# Patient Record
Sex: Female | Born: 1990 | Race: White | Hispanic: No | Marital: Single | Smoking: Current every day smoker
Health system: Southern US, Community
[De-identification: ages and names within clinical notes are randomized; demographics above are authoritative.]

## PROBLEM LIST (undated history)

## (undated) DIAGNOSIS — T4145XA Adverse effect of unspecified anesthetic, initial encounter: Secondary | ICD-10-CM

## (undated) DIAGNOSIS — Z8489 Family history of other specified conditions: Secondary | ICD-10-CM

## (undated) DIAGNOSIS — T8859XA Other complications of anesthesia, initial encounter: Secondary | ICD-10-CM

## (undated) DIAGNOSIS — E785 Hyperlipidemia, unspecified: Secondary | ICD-10-CM

## (undated) DIAGNOSIS — E559 Vitamin D deficiency, unspecified: Principal | ICD-10-CM

## (undated) DIAGNOSIS — M109 Gout, unspecified: Secondary | ICD-10-CM

## (undated) DIAGNOSIS — M19071 Primary osteoarthritis, right ankle and foot: Secondary | ICD-10-CM

## (undated) HISTORY — DX: Vitamin D deficiency, unspecified: E55.9

## (undated) HISTORY — PX: ANKLE ARTHROSCOPY: SHX545

## (undated) HISTORY — DX: Hyperlipidemia, unspecified: E78.5

---
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
## 2011-04-09 ENCOUNTER — Other Ambulatory Visit (HOSPITAL_COMMUNITY)
Admission: RE | Admit: 2011-04-09 | Discharge: 2011-04-09 | Disposition: A | Discharge status: Home / Self Care | Payer: 59 | Source: Ambulatory Visit | Attending: Obstetrics and Gynecology | Admitting: Obstetrics and Gynecology

## 2011-04-09 DIAGNOSIS — Z01419 Encounter for gynecological examination (general) (routine) without abnormal findings: Secondary | ICD-10-CM | POA: Insufficient documentation

## 2011-04-09 DIAGNOSIS — Z113 Encounter for screening for infections with a predominantly sexual mode of transmission: Secondary | ICD-10-CM | POA: Insufficient documentation

## 2013-04-28 ENCOUNTER — Encounter: Payer: Self-pay | Admitting: Adult Health

## 2013-04-28 ENCOUNTER — Other Ambulatory Visit (HOSPITAL_COMMUNITY)
Admission: RE | Admit: 2013-04-28 | Discharge: 2013-04-28 | Disposition: A | Discharge status: Home / Self Care | Payer: 59 | Source: Ambulatory Visit | Attending: Obstetrics and Gynecology | Admitting: Obstetrics and Gynecology

## 2013-04-28 ENCOUNTER — Ambulatory Visit (INDEPENDENT_AMBULATORY_CARE_PROVIDER_SITE_OTHER): Payer: Self-pay | Admitting: Adult Health

## 2013-04-28 VITALS — BP 110/64 | HR 74 | Ht 70.0 in | Wt 131.0 lb

## 2013-04-28 DIAGNOSIS — Z01419 Encounter for gynecological examination (general) (routine) without abnormal findings: Secondary | ICD-10-CM | POA: Insufficient documentation

## 2013-04-28 DIAGNOSIS — F439 Reaction to severe stress, unspecified: Secondary | ICD-10-CM

## 2013-04-28 DIAGNOSIS — F329 Major depressive disorder, single episode, unspecified: Secondary | ICD-10-CM | POA: Insufficient documentation

## 2013-04-28 MED ORDER — CITALOPRAM HYDROBROMIDE 20 MG PO TABS: 20.0000 mg | ORAL_TABLET | Freq: Every day | ORAL | Status: DC

## 2013-04-28 MED ORDER — NORGESTIMATE-ETH ESTRADIOL 0.25-35 MG-MCG PO TABS: 1.0000 | ORAL_TABLET | Freq: Every day | ORAL | Status: DC

## 2013-04-28 NOTE — Patient Instructions (Addendum)
Depression, Adult Depression refers to feeling sad, low, down in the dumps, blue, gloomy, or empty. In general, there are two kinds of depression: 1. Depression that we all experience from time to time because of upsetting life experiences, including the loss of a job or the ending of a relationship (normal sadness or normal grief). This kind of depression is considered normal, is short lived, and resolves within a few days to 2 weeks. (Depression experienced after the loss of a loved one is called bereavement. Bereavement often lasts longer than 2 weeks but normally gets better with time.) 2. Clinical depression, which lasts longer than normal sadness or normal grief or interferes with your ability to function at home, at work, and in school. It also interferes with your personal relationships. It affects almost every aspect of your life. Clinical depression is an illness. Symptoms of depression also can be caused by conditions other than normal sadness and grief or clinical depression. Examples of these conditions are listed as follows:  Physical illness Some physical illnesses, including underactive thyroid gland (hypothyroidism), severe anemia, specific types of cancer, diabetes, uncontrolled seizures, heart and lung problems, strokes, and chronic pain are commonly associated with symptoms of depression.  Side effects of some prescription medicine In some people, certain types of prescription medicine can cause symptoms of depression.  Substance abuse Abuse of alcohol and illicit drugs can cause symptoms of depression. SYMPTOMS Symptoms of normal sadness and normal grief include the following:  Feeling sad or crying for short periods of time.  Not caring about anything (apathy).  Difficulty sleeping or sleeping too much.  No longer able to enjoy the things you used to enjoy.  Desire to be by oneself all the time (social isolation).  Lack of energy or motivation.  Difficulty  concentrating or remembering.  Change in appetite or weight.  Restlessness or agitation. Symptoms of clinical depression include the same symptoms of normal sadness or normal grief and also the following symptoms:  Feeling sad or crying all the time.  Feelings of guilt or worthlessness.  Feelings of hopelessness or helplessness.  Thoughts of suicide or the desire to harm yourself (suicidal ideation).  Loss of touch with reality (psychotic symptoms). Seeing or hearing things that are not real (hallucinations) or having false beliefs about your life or the people around you (delusions and paranoia). DIAGNOSIS  The diagnosis of clinical depression usually is based on the severity and duration of the symptoms. Your caregiver also will ask you questions about your medical history and substance use to find out if physical illness, use of prescription medicine, or substance abuse is causing your depression. Your caregiver also may order blood tests. TREATMENT  Typically, normal sadness and normal grief do not require treatment. However, sometimes antidepressant medicine is prescribed for bereavement to ease the depressive symptoms until they resolve. The treatment for clinical depression depends on the severity of your symptoms but typically includes antidepressant medicine, counseling with a mental health professional, or a combination of both. Your caregiver will help to determine what treatment is best for you. Depression caused by physical illness usually goes away with appropriate medical treatment of the illness. If prescription medicine is causing depression, talk with your caregiver about stopping the medicine, decreasing the dose, or substituting another medicine. Depression caused by abuse of alcohol or illicit drugs abuse goes away with abstinence from these substances. Some adults need professional help in order to stop drinking or using drugs. SEEK IMMEDIATE CARE IF:  You have   thoughts  about hurting yourself or others.  You lose touch with reality (have psychotic symptoms).  You are taking medicine for depression and have a serious side effect. FOR MORE INFORMATION National Alliance on Mental Illness: www.nami.[employer]: http://www.maynard.net/ Document Released: 08/03/2000 Document Revised: 02/05/2012 Document Reviewed: 11/05/2011 [facility] Patient Information 2014 [place], [place]. Stress Stress-related medical problems are becoming increasingly common. The body has a built-in physical response to stressful situations. Faced with pressure, challenge or danger, we need to react quickly. Our bodies release hormones such as cortisol and adrenaline to help do this. These hormones are part of the "fight or flight" response and affect the metabolic rate, heart rate and blood pressure, resulting in a heightened, stressed state that prepares the body for optimum performance in dealing with a stressful situation. It is likely that early man required these mechanisms to stay alive, but usually modern stresses do not call for this, and the same hormones released in today's world can damage health and reduce coping ability. CAUSES  Pressure to perform at work, at school or in sports.  Threats of physical violence.  Money worries.  Arguments.  Family conflicts.  Divorce or separation from significant other.  Bereavement.  New job or unemployment.  Changes in location.  Alcohol or drug abuse. SOMETIMES, THERE IS NO PARTICULAR REASON FOR DEVELOPING STRESS. Almost all people are at risk of being stressed at some time in their lives. It is important to know that some stress is temporary and some is long term.  Temporary stress will go away when a situation is resolved. Most people can cope with short periods of stress, and it can often be relieved by relaxing, taking a walk, chatting through issues with friends, or having a good night's  sleep.  Chronic (long-term, continuous) stress is much harder to deal with. It can be psychologically and emotionally damaging. It can be harmful both for an individual and for friends and family. SYMPTOMS Everyone reacts to stress differently. There are some common effects that help [place] recognize it. In times of extreme stress, people may:  Shake uncontrollably.  Breathe faster and deeper than normal (hyperventilate).  Vomit.  For people with asthma, stress can trigger an attack.  For some people, stress may trigger migraine headaches, ulcers, and body pain. PHYSICAL EFFECTS OF STRESS MAY INCLUDE:  Loss of energy.  Skin problems.  Aches and pains resulting from tense muscles, including neck ache, backache and tension headaches.  Increased pain from arthritis and other conditions.  Irregular heart beat (palpitations).  Periods of irritability or anger.  Apathy or depression.  Anxiety (feeling uptight or worrying).  Unusual behavior.  Loss of appetite.  Comfort eating.  Lack of concentration.  Loss of, or decreased, sex-drive.  Increased smoking, drinking, or recreational drug use.  For women, missed periods.  Ulcers, joint pain, and muscle pain. Post-traumatic stress is the stress caused by any serious accident, strong emotional damage, or extremely difficult or violent experience such as rape or war. Post-traumatic stress victims can experience mixtures of emotions such as fear, shame, depression, guilt or anger. It may include recurrent memories or images that may be haunting. These feelings can last for weeks, months or even years after the traumatic event that triggered them. Specialized treatment, possibly with medicines and psychological therapies, is available. If stress is causing physical symptoms, severe distress or making it difficult for you to function as normal, it is worth seeing your caregiver. It is important to remember  that although stress is a  usual part of life, extreme or prolonged stress can lead to other illnesses that will need treatment. It is better to visit a doctor sooner rather than later. Stress has been linked to the development of high blood pressure and heart disease, as well as insomnia and depression. There is no diagnostic test for stress since everyone reacts to it differently. But a caregiver will be able to spot the physical symptoms, such as:  Headaches.  Shingles.  Ulcers. Emotional distress such as intense worry, low mood or irritability should be detected when the doctor asks pertinent questions to identify any underlying problems that might be the cause. In case there are physical reasons for the symptoms, the doctor may also want to do some tests to exclude certain conditions. If you feel that you are suffering from stress, try to identify the aspects of your life that are causing it. Sometimes you may not be able to change or avoid them, but even a small change can have a positive ripple effect. A simple lifestyle change can make all the difference. STRATEGIES THAT CAN HELP DEAL WITH STRESS:  Delegating or sharing responsibilities.  Avoiding confrontations.  Learning to be more assertive.  Regular exercise.  Avoid using alcohol or street drugs to cope.  Eating a healthy, balanced diet, rich in fruit and vegetables and proteins.  Finding humor or absurdity in stressful situations.  Never taking on more than you know you can handle comfortably.  Organizing your time better to get as much done as possible.  Talking to friends or family and sharing your thoughts and fears.  Listening to music or relaxation tapes.  Tensing and then relaxing your muscles, starting at the toes and working up to the head and neck. If you think that you would benefit from help, either in identifying the things that are causing your stress or in learning techniques to help you relax, see a caregiver who is capable of  helping you with this. Rather than relying on medications, it is usually better to try and identify the things in your life that are causing stress and try to deal with them. There are many techniques of managing stress including counseling, psychotherapy, aromatherapy, yoga, and exercise. Your caregiver can help you determine what is best for you. Document Released: 10/27/2002 Document Revised: 10/29/2011 Document Reviewed: 09/23/2007 [facility] Patient Information 2014 [place], [place]. Try celexa Go to AA for families Physical in 1 year

## 2013-04-28 NOTE — Progress Notes (Signed)
Patient ID: Wendy Allen, female   DOB: 1991/01/10, 22 y.o.   MRN: [id number] History of Present Illness: Wendy Allen is a 22 year old white female married in for pap and physical.   Current Medications, Allergies, Past Medical History, Past Surgical History, Family History and Social History were reviewed in [employer] record.     Review of Systems: Patient denies any headaches, blurred vision, shortness of breath, chest pain, abdominal pain, problems with bowel movements, urination, or intercourse. No joint pain, she does have hair loss and is stressed with work and school and home.She is depressed at times.Her husband drinks and has had 3 DUIs and a upcoming court date, he is sp a head injury.    Physical Exam:BP 110/64  Pulse 74  Ht 5\' 10"  (1.778 m)  Wt 131 lb (59.421 kg)  BMI 18.8 kg/m2  LMP 04/17/2013 General:  Well developed, well nourished, no acute distress Skin:  Warm and dry Neck:  Midline trachea, normal thyroid Lungs; Clear to auscultation bilaterally Breast:  No dominant palpable mass, retraction, or nipple discharge Cardiovascular: Regular rate and rhythm Abdomen:  Soft, non tender, no hepatosplenomegaly Pelvic:  External genitalia is normal in appearance.  The vagina is normal in appearance. The cervix is nullipareous. Pap performed. Uterus is felt to be normal size, shape, and contour.  No                adnexal masses or tenderness noted. Extremities:  No swelling or varicosities noted Psych:  Alert and cooperative not happy   Impression: Yearly exam Stress Depression    Plan: Physical in 1 year Refill sprintec x 1 year Rx celexa 20 mg #30 1 daily with 6 refills, call with F/U Review hand out on stress and depression Call AA Call Faith in fam[place] given

## 2013-09-10 DIAGNOSIS — M7542 Impingement syndrome of left shoulder: Secondary | ICD-10-CM | POA: Insufficient documentation

## 2014-04-02 ENCOUNTER — Other Ambulatory Visit: Payer: Self-pay | Admitting: Adult Health

## 2014-05-04 ENCOUNTER — Ambulatory Visit (INDEPENDENT_AMBULATORY_CARE_PROVIDER_SITE_OTHER): Payer: BC Managed Care – PPO | Admitting: Adult Health

## 2014-05-04 ENCOUNTER — Encounter: Payer: Self-pay | Admitting: Adult Health

## 2014-05-04 VITALS — BP 110/60 | HR 76 | Ht 68.0 in | Wt 148.0 lb

## 2014-05-04 DIAGNOSIS — Z01419 Encounter for gynecological examination (general) (routine) without abnormal findings: Secondary | ICD-10-CM

## 2014-05-04 DIAGNOSIS — Z3041 Encounter for surveillance of contraceptive pills: Secondary | ICD-10-CM

## 2014-05-04 DIAGNOSIS — Z3202 Encounter for pregnancy test, result negative: Secondary | ICD-10-CM

## 2014-05-04 LAB — POCT URINE PREGNANCY: Preg Test, Ur: NEGATIVE

## 2014-05-04 MED ORDER — NORGESTIMATE-ETH ESTRADIOL 0.25-35 MG-MCG PO TABS: ORAL_TABLET | ORAL | Status: DC

## 2014-05-04 NOTE — Patient Instructions (Signed)
Start OCs today use condoms Pap and physical in 1 year

## 2014-05-04 NOTE — Progress Notes (Signed)
Patient ID: Wendy Allen, female   DOB: 02-10-1991, 23 y.o.   MRN: [id number] History of Present Illness: Wendy Allen is a 23 year old white female, separated, in for a gyn physical and get OCs refilled.Had a normal pap 04/28/13.Has been off pills for a few days.   Current Medications, Allergies, Past Medical History, Past Surgical History, Family History and Social History were reviewed in [employer] record.     Review of Systems: Patient denies any headaches, blurred vision, shortness of breath, chest pain, abdominal pain, problems with bowel movements, urination, or intercourse. No joint pain or mood swings She is Production designer, theatre/television/film at [employer] in [place] and has new partner and is very happy, has stopped celexa.    Physical Exam:BP 110/60  Pulse 76  Ht  (1.727 m)  Wt 148 lb (67.132 kg)  BMI 22.51 kg/m2  LMP 09/06/2015UPT negative General:  Well developed, well nourished, no acute distress Skin:  Warm and dry Neck:  Midline trachea, normal thyroid Lungs; Clear to auscultation bilaterally Breast:  No dominant palpable mass, retraction, or nipple discharge Cardiovascular: Regular rate and rhythm Abdomen:  Soft, non tender, no hepatosplenomegaly Pelvic:  External genitalia is normal in appearance.  The vagina is normal in appearance. The cervix is smooth.  Uterus is felt to be normal size, shape, and contour.  No adnexal masses or tenderness noted. Extremities:  No swelling or varicosities noted Psych:  No mood changes, alert and cooperative,seems happy   Impression: Yearly gyn exam no pap Contraceptive management   Plan: Refilled sprintec x 1 year can start today, use condoms Pap and physicals in 1 year

## 2015-02-14 ENCOUNTER — Other Ambulatory Visit: Payer: Self-pay | Admitting: Adult Health

## 2015-05-12 ENCOUNTER — Encounter: Payer: Self-pay | Admitting: Adult Health

## 2015-05-12 ENCOUNTER — Ambulatory Visit (INDEPENDENT_AMBULATORY_CARE_PROVIDER_SITE_OTHER): Payer: BLUE CROSS/BLUE SHIELD | Admitting: Adult Health

## 2015-05-12 ENCOUNTER — Other Ambulatory Visit (HOSPITAL_COMMUNITY)
Admission: RE | Admit: 2015-05-12 | Discharge: 2015-05-12 | Disposition: A | Discharge status: Home / Self Care | Payer: BLUE CROSS/BLUE SHIELD | Source: Ambulatory Visit | Attending: Adult Health | Admitting: Adult Health

## 2015-05-12 VITALS — BP 120/74 | HR 80 | Ht 70.0 in | Wt 146.0 lb

## 2015-05-12 DIAGNOSIS — Z01419 Encounter for gynecological examination (general) (routine) without abnormal findings: Secondary | ICD-10-CM

## 2015-05-12 DIAGNOSIS — Z3202 Encounter for pregnancy test, result negative: Secondary | ICD-10-CM | POA: Diagnosis not present

## 2015-05-12 DIAGNOSIS — Z113 Encounter for screening for infections with a predominantly sexual mode of transmission: Secondary | ICD-10-CM | POA: Diagnosis present

## 2015-05-12 DIAGNOSIS — F439 Reaction to severe stress, unspecified: Secondary | ICD-10-CM

## 2015-05-12 DIAGNOSIS — Z3041 Encounter for surveillance of contraceptive pills: Secondary | ICD-10-CM

## 2015-05-12 DIAGNOSIS — Z01411 Encounter for gynecological examination (general) (routine) with abnormal findings: Secondary | ICD-10-CM | POA: Diagnosis not present

## 2015-05-12 LAB — POCT URINE PREGNANCY: PREG TEST UR: NEGATIVE

## 2015-05-12 MED ORDER — NORGESTIMATE-ETH ESTRADIOL 0.25-35 MG-MCG PO TABS: 1.0000 | ORAL_TABLET | Freq: Every day | ORAL | Status: DC

## 2015-05-12 MED ORDER — CITALOPRAM HYDROBROMIDE 20 MG PO TABS: 20.0000 mg | ORAL_TABLET | Freq: Every day | ORAL | Status: DC

## 2015-05-12 NOTE — Patient Instructions (Signed)
Start celexa Physical in 1 year

## 2015-05-12 NOTE — Progress Notes (Signed)
Patient ID: Wendy Allen, female   DOB: August 15, 1991, 24 y.o.   MRN: [id number] History of Present Illness: Wendy Allen is a 24 year old white female, in a divorce, in for well woman gyn exam and pap.She is stressed at work and has almost daily headache. Says she is not depressed.She is happy with her OCs, but was late starting them this month, requests UPT.   Current Medications, Allergies, Past Medical History, Past Surgical History, Family History and Social History were reviewed in [employer] record.     Review of Systems: Patient denies any hearing loss, fatigue, blurred vision, shortness of breath, chest pain, abdominal pain, problems with bowel movements, urination, or intercourse. No joint pain or mood swings. See HPI for positives.   Physical Exam:BP 120/74 mmHg  Pulse 80  Ht  (1.778 m)  Wt 146 lb (66.225 kg)  BMI 20.95 kg/m2  LMP 05/05/2015 UPT negative General:  Well developed, well nourished, no acute distress Skin:  Warm and dry Neck:  Midline trachea, normal thyroid, good ROM, no lymphadenopathy Lungs; Clear to auscultation bilaterally Breast:  No dominant palpable mass, retraction, or nipple discharge Cardiovascular: Regular rate and rhythm Abdomen:  Soft, non tender, no hepatosplenomegaly Pelvic:  External genitalia is normal in appearance, no lesions.  The vagina is normal in appearance. Urethra has no lesions or masses. The cervix is smooth, pap with GC/CHL performed.Marland Kitchen  Uterus is felt to be normal size, shape, and contour.  No adnexal masses or tenderness noted.Bladder is non tender, no masses felt. Extremities/musculoskeletal:  No swelling or varicosities noted, no clubbing or cyanosis Psych:  No mood changes, alert and cooperative,seems happy Will rx celexa, has used in past with good results, give it 2-4 weeks then let me know how she is doing.  Impression: Well woman gyn exam with pap Contraceptive management Stress     Plan: Rx  celexa 20 mg #30 take 1 daily with 6 refills Refilled sprintec x 1 year Physical in 1 year Take time for self

## 2015-05-17 LAB — CYTOLOGY - PAP

## 2015-06-13 ENCOUNTER — Emergency Department (HOSPITAL_COMMUNITY)
Admission: EM | Admit: 2015-06-13 | Discharge: 2015-06-13 | Disposition: A | Discharge status: Home / Self Care | Payer: BLUE CROSS/BLUE SHIELD | Attending: Emergency Medicine | Admitting: Emergency Medicine

## 2015-06-13 ENCOUNTER — Encounter (HOSPITAL_COMMUNITY): Payer: Self-pay | Admitting: *Deleted

## 2015-06-13 DIAGNOSIS — Z720 Tobacco use: Secondary | ICD-10-CM | POA: Diagnosis not present

## 2015-06-13 DIAGNOSIS — Z79899 Other long term (current) drug therapy: Secondary | ICD-10-CM | POA: Insufficient documentation

## 2015-06-13 DIAGNOSIS — R51 Headache: Secondary | ICD-10-CM | POA: Diagnosis present

## 2015-06-13 DIAGNOSIS — H04011 Acute dacryoadenitis, right lacrimal gland: Secondary | ICD-10-CM | POA: Insufficient documentation

## 2015-06-13 DIAGNOSIS — Z793 Long term (current) use of hormonal contraceptives: Secondary | ICD-10-CM | POA: Insufficient documentation

## 2015-06-13 MED ORDER — IBUPROFEN 800 MG PO TABS
800.0000 mg | ORAL_TABLET | Freq: Once | ORAL | Status: AC
  Administered 2015-06-13: 800 mg via ORAL
  Filled 2015-06-13: qty 1

## 2015-06-13 MED ORDER — CEPHALEXIN 500 MG PO CAPS: 500.0000 mg | ORAL_CAPSULE | Freq: Four times a day (QID) | ORAL | Status: DC

## 2015-06-13 MED ORDER — CEPHALEXIN 500 MG PO CAPS
500.0000 mg | ORAL_CAPSULE | Freq: Once | ORAL | Status: AC
  Administered 2015-06-13: 500 mg via ORAL
  Filled 2015-06-13: qty 1

## 2015-06-13 MED ORDER — IBUPROFEN 800 MG PO TABS: 800.0000 mg | ORAL_TABLET | Freq: Three times a day (TID) | ORAL | Status: DC

## 2015-06-13 NOTE — Discharge Instructions (Signed)
Take the medicines as prescribed until gone.  Avoid rubbing your eyebrow area.  Apply warm moist compresses for 10 minutes 3-4 times daily.

## 2015-06-13 NOTE — ED Notes (Signed)
Pt c/o eye pain to right eye and states she has a headache

## 2015-06-15 NOTE — ED Provider Notes (Signed)
CSN: [id number]     Arrival date & time 06/13/15  2013 History   First MD Initiated Contact with Patient 06/13/15 2022     Chief Complaint  Patient presents with  . Eye Pain     (Consider location/radiation/quality/duration/timing/severity/associated sxs/prior Treatment) The history is provided by the patient and the spouse.   Wendy Allen is a 24 y.o. female presenting with pain and swelling along her right upper eyelid and brow area.  She describes just casually touching the site today when she noted it to be tender (bruised feeling) and slightly swollen. She denies injury, falls.  Denies visual changes, draining from the eye, pain with eye movement.  She does endorse mild right sided headache. She has taken no medicines or tried any treatments prior to arrival.    Past Medical History  Diagnosis Date  . Contraceptive management 05/04/2014  . Headache    Past Surgical History  Procedure Laterality Date  . Ankle surgery Right 2008   Family History  Problem Relation Age of Onset  . Hypertension Father   . Hyperlipidemia Father   . Other Father     peripheral artery disease  . Diabetes Paternal Grandmother   . Hyperlipidemia Paternal Grandmother   . Hypertension Paternal Grandmother   . Cancer Paternal Grandmother     skin   . Hypertension Paternal Grandfather   . Hyperlipidemia Paternal Grandfather    Social History  Substance Use Topics  . Smoking status: Current Every Day Smoker -- 0.25 packs/day for 8 years    Types: Cigarettes  . Smokeless tobacco: Never Used  . Alcohol Use: No   OB History    Gravida Para Term Preterm AB TAB SAB Ectopic Multiple Living   0 0 0 0 0 0 0 0 0 0      Review of Systems  Constitutional: Negative for fever.  HENT: Negative for congestion and sore throat.   Eyes: Positive for pain. Negative for photophobia, redness and visual disturbance.  Respiratory: Negative for chest tightness and shortness of breath.   Cardiovascular:  Negative for chest pain.  Gastrointestinal: Negative for nausea and abdominal pain.  Genitourinary: Negative.   Musculoskeletal: Negative for joint swelling, arthralgias and neck pain.  Skin: Negative.  Negative for rash and wound.  Neurological: Negative for dizziness, weakness, light-headedness, numbness and headaches.  Psychiatric/Behavioral: Negative.       Allergies  Vicodin and Ortho tri-cyclen  Home Medications   Prior to Admission medications   Medication Sig Start Date End Date Taking? Authorizing Provider  citalopram (CELEXA) 20 MG tablet Take 1 tablet (20 mg total) by mouth daily. 05/12/15  Yes Adline PotterJennifer A Griffin, NP  norgestimate-ethinyl estradiol (SPRINTEC 28) 0.25-35 MG-MCG tablet Take 1 tablet by mouth daily. 05/12/15  Yes Adline PotterJennifer A Griffin, NP  cephALEXin (KEFLEX) 500 MG capsule Take 1 capsule (500 mg total) by mouth 4 (four) times daily. 06/13/15   Burgess AmorJulie Makena Mcgrady, PA-C  ibuprofen (ADVIL,MOTRIN) 800 MG tablet Take 1 tablet (800 mg total) by mouth 3 (three) times daily. 06/13/15   Burgess AmorJulie Jo-Anne Kluth, PA-C   BP 119/81 mmHg  Pulse 79  Temp(Src) 97.7 F (36.5 C) (Oral)  Resp 20  Ht 5\' 10"  (1.778 m)  Wt 155 lb (70.308 kg)  BMI 22.24 kg/m2  SpO2 100%  LMP 06/06/2015 Physical Exam  Constitutional: She is oriented to person, place, and time. She appears well-developed and well-nourished.  HENT:  Head: Normocephalic and atraumatic.  Right Ear: Tympanic membrane and ear canal normal.  Left Ear: Tympanic membrane and ear canal normal.  Nose: No mucosal edema or rhinorrhea.  Mouth/Throat: Uvula is midline, oropharynx is clear and moist and mucous membranes are normal. No oropharyngeal exudate, posterior oropharyngeal edema, posterior oropharyngeal erythema or tonsillar abscesses.  Eyes: Conjunctivae and EOM are normal. Pupils are equal, round, and reactive to light. Lids are everted and swept, no foreign bodies found. Right eye exhibits no chemosis, no discharge, no exudate and no  hordeolum. No foreign body present in the right eye. Left eye exhibits no discharge. Right conjunctiva is not injected.    ttp with mild edema noted right upper lateral eyelid/superior orbital margin. No erythema.  Unable to evert upper lid enough to visualize lacrimal gland.  No temporal artery pain or induration.  Neck: Normal range of motion.  Cardiovascular: Normal rate and normal heart sounds.   Pulmonary/Chest: Effort normal. No respiratory distress.  Musculoskeletal: Normal range of motion.  Neurological: She is alert and oriented to person, place, and time.  Skin: Skin is warm and dry. No rash noted.  Psychiatric: She has a normal mood and affect.    ED Course  Procedures (including critical care time) Labs Review Labs Reviewed - No data to display  Imaging Review No results found. I have personally reviewed and evaluated these images and lab results as part of my medical decision-making.   EKG Interpretation None      MDM   Final diagnoses:  Dacryoadenitis, acute, right    Suspect inflammation of right lacrimal gland. Will also cover for possible early infection.  Keflex, ibuprofen.  Encouraged warm soaks, avoid rubbing site.  Referral to Dr. Lita Mains for further management if sx persist or worsen.  Pt understands and agrees with plan.    Burgess Amor, PA-C 06/15/15 1436  Zadie Rhine, MD 06/16/15 [phone]

## 2016-03-05 ENCOUNTER — Other Ambulatory Visit: Payer: Self-pay | Admitting: Orthopedic Surgery

## 2016-03-05 DIAGNOSIS — E559 Vitamin D deficiency, unspecified: Secondary | ICD-10-CM

## 2016-03-05 DIAGNOSIS — E785 Hyperlipidemia, unspecified: Secondary | ICD-10-CM

## 2016-03-12 ENCOUNTER — Ambulatory Visit (INDEPENDENT_AMBULATORY_CARE_PROVIDER_SITE_OTHER): Payer: BLUE CROSS/BLUE SHIELD | Admitting: Adult Health

## 2016-03-12 ENCOUNTER — Encounter: Payer: Self-pay | Admitting: Adult Health

## 2016-03-12 VITALS — BP 116/60 | HR 84 | Ht 70.0 in | Wt 147.5 lb

## 2016-03-12 DIAGNOSIS — R591 Generalized enlarged lymph nodes: Secondary | ICD-10-CM

## 2016-03-12 DIAGNOSIS — R599 Enlarged lymph nodes, unspecified: Secondary | ICD-10-CM

## 2016-03-12 NOTE — Patient Instructions (Signed)
Leave lymph node alone  Will recheck in 4 weeks

## 2016-03-12 NOTE — Progress Notes (Signed)
Subjective:     Patient ID: Wendy Allen, female   DOB: 1991/02/14, 25 y.o.   MRN: [id number]  HPI Wendy Allen is a 25 year old white female, in complaining of a knot near left hip for 3 weeks and it is non painful.No known injury but got new tattoo.  Review of Systems  +knot near left hip , no pain or injury Reviewed past medical,surgical, social and family history. Reviewed medications and allergies.     Objective:   Physical Exam BP 116/60 (BP Location: Left Arm, Patient Position: Sitting, Cuff Size: Normal)   Pulse 84   Ht 5\' 10"  (1.778 m)   Wt 147 lb 8 oz (66.9 kg)   LMP 02/27/2016 (Approximate)   BMI 21.16 kg/m Skin warm and dry, has 1 cm firm, non tender, non mobile nodule,feels like swollen lymph in left groin,will check CBC and recheck in 4 weeks.Pt having surgery right ankle 03/29/16.    Assessment:     Swollen lymph node in left groin     Plan:     Check CBC Follow up in 4 weeks  Leave alone

## 2016-03-13 LAB — CBC
HEMATOCRIT: 41.6 % (ref 34.0–46.6)
HEMOGLOBIN: 13.9 g/dL (ref 11.1–15.9)
MCH: 30.8 pg (ref 26.6–33.0)
MCHC: 33.4 g/dL (ref 31.5–35.7)
MCV: 92 fL (ref 79–97)
Platelets: 217 10*3/uL (ref 150–379)
RBC: 4.51 x10E6/uL (ref 3.77–5.28)
RDW: 12.6 % (ref 12.3–15.4)
WBC: 9.6 10*3/uL (ref 3.4–10.8)

## 2016-03-20 DIAGNOSIS — M19071 Primary osteoarthritis, right ankle and foot: Secondary | ICD-10-CM

## 2016-03-20 HISTORY — DX: Primary osteoarthritis, right ankle and foot: M19.071

## 2016-03-22 ENCOUNTER — Encounter (HOSPITAL_BASED_OUTPATIENT_CLINIC_OR_DEPARTMENT_OTHER): Payer: Self-pay | Admitting: *Deleted

## 2016-03-29 ENCOUNTER — Encounter (HOSPITAL_BASED_OUTPATIENT_CLINIC_OR_DEPARTMENT_OTHER)
Admission: RE | Disposition: A | Discharge status: Home / Self Care | Payer: Self-pay | Source: Ambulatory Visit | Attending: Orthopedic Surgery

## 2016-03-29 ENCOUNTER — Ambulatory Visit (HOSPITAL_BASED_OUTPATIENT_CLINIC_OR_DEPARTMENT_OTHER)
Admission: RE | Admit: 2016-03-29 | Discharge: 2016-03-29 | Disposition: A | Discharge status: Home / Self Care | Payer: BLUE CROSS/BLUE SHIELD | Source: Ambulatory Visit | Attending: Orthopedic Surgery | Admitting: Orthopedic Surgery

## 2016-03-29 ENCOUNTER — Ambulatory Visit (HOSPITAL_BASED_OUTPATIENT_CLINIC_OR_DEPARTMENT_OTHER): Payer: BLUE CROSS/BLUE SHIELD | Admitting: Certified Registered"

## 2016-03-29 ENCOUNTER — Encounter (HOSPITAL_BASED_OUTPATIENT_CLINIC_OR_DEPARTMENT_OTHER): Payer: Self-pay | Admitting: Certified Registered"

## 2016-03-29 DIAGNOSIS — Z87891 Personal history of nicotine dependence: Secondary | ICD-10-CM | POA: Diagnosis not present

## 2016-03-29 DIAGNOSIS — Z885 Allergy status to narcotic agent status: Secondary | ICD-10-CM | POA: Insufficient documentation

## 2016-03-29 DIAGNOSIS — Z881 Allergy status to other antibiotic agents status: Secondary | ICD-10-CM | POA: Diagnosis not present

## 2016-03-29 DIAGNOSIS — M19071 Primary osteoarthritis, right ankle and foot: Secondary | ICD-10-CM | POA: Insufficient documentation

## 2016-03-29 DIAGNOSIS — Z888 Allergy status to other drugs, medicaments and biological substances status: Secondary | ICD-10-CM | POA: Diagnosis not present

## 2016-03-29 HISTORY — DX: Other complications of anesthesia, initial encounter: T88.59XA

## 2016-03-29 HISTORY — DX: Primary osteoarthritis, right ankle and foot: M19.071

## 2016-03-29 HISTORY — DX: Adverse effect of unspecified anesthetic, initial encounter: T41.45XA

## 2016-03-29 HISTORY — PX: ANKLE ARTHROSCOPY WITH ARTHRODESIS: SHX5579

## 2016-03-29 HISTORY — DX: Family history of other specified conditions: Z84.89

## 2016-03-29 SURGERY — ARTHROSCOPY, ANKLE, WITH FUSION
Anesthesia: General | Site: Ankle | Laterality: Right

## 2016-03-29 MED ORDER — LACTATED RINGERS IV SOLN
INTRAVENOUS | Status: DC
  Administered 2016-03-29 (×2): via INTRAVENOUS

## 2016-03-29 MED ORDER — CEFAZOLIN SODIUM-DEXTROSE 2-4 GM/100ML-% IV SOLN
INTRAVENOUS | Status: AC
  Filled 2016-03-29: qty 100

## 2016-03-29 MED ORDER — HYDROMORPHONE HCL 1 MG/ML IJ SOLN: 0.2500 mg | INTRAMUSCULAR | Status: DC | PRN

## 2016-03-29 MED ORDER — BUPIVACAINE-EPINEPHRINE (PF) 0.5% -1:200000 IJ SOLN
INTRAMUSCULAR | Status: AC
  Filled 2016-03-29: qty 30

## 2016-03-29 MED ORDER — LIDOCAINE 2% (20 MG/ML) 5 ML SYRINGE
INTRAMUSCULAR | Status: AC
  Filled 2016-03-29: qty 5

## 2016-03-29 MED ORDER — ONDANSETRON HCL 4 MG/2ML IJ SOLN
INTRAMUSCULAR | Status: DC | PRN
  Administered 2016-03-29: 4 mg via INTRAVENOUS

## 2016-03-29 MED ORDER — ONDANSETRON HCL 4 MG/2ML IJ SOLN
INTRAMUSCULAR | Status: AC
  Filled 2016-03-29: qty 2

## 2016-03-29 MED ORDER — ROPIVACAINE HCL 5 MG/ML IJ SOLN
INTRAMUSCULAR | Status: DC | PRN
  Administered 2016-03-29 (×2): 10 mL via PERINEURAL

## 2016-03-29 MED ORDER — MIDAZOLAM HCL 2 MG/2ML IJ SOLN
INTRAMUSCULAR | Status: AC
  Filled 2016-03-29: qty 2

## 2016-03-29 MED ORDER — FENTANYL CITRATE (PF) 100 MCG/2ML IJ SOLN
INTRAMUSCULAR | Status: AC
  Filled 2016-03-29: qty 2

## 2016-03-29 MED ORDER — MEPERIDINE HCL 25 MG/ML IJ SOLN: 6.2500 mg | INTRAMUSCULAR | Status: DC | PRN

## 2016-03-29 MED ORDER — PROPOFOL 10 MG/ML IV BOLUS
INTRAVENOUS | Status: DC | PRN
  Administered 2016-03-29: 150 mg via INTRAVENOUS

## 2016-03-29 MED ORDER — CEFAZOLIN SODIUM-DEXTROSE 2-4 GM/100ML-% IV SOLN
2.0000 g | INTRAVENOUS | Status: AC
  Administered 2016-03-29: 2 g via INTRAVENOUS

## 2016-03-29 MED ORDER — PROPOFOL 500 MG/50ML IV EMUL
INTRAVENOUS | Status: AC
  Filled 2016-03-29: qty 50

## 2016-03-29 MED ORDER — SENNA 8.6 MG PO TABS: 2.0000 | ORAL_TABLET | Freq: Two times a day (BID) | ORAL | 0 refills | Status: DC

## 2016-03-29 MED ORDER — ASPIRIN EC 81 MG PO TBEC: 81.0000 mg | DELAYED_RELEASE_TABLET | Freq: Two times a day (BID) | ORAL | 0 refills | Status: DC

## 2016-03-29 MED ORDER — DEXAMETHASONE SODIUM PHOSPHATE 10 MG/ML IJ SOLN
INTRAMUSCULAR | Status: AC
  Filled 2016-03-29: qty 1

## 2016-03-29 MED ORDER — FENTANYL CITRATE (PF) 100 MCG/2ML IJ SOLN
50.0000 ug | INTRAMUSCULAR | Status: DC | PRN
  Administered 2016-03-29: 50 ug via INTRAVENOUS
  Administered 2016-03-29: 100 ug via INTRAVENOUS

## 2016-03-29 MED ORDER — DEXAMETHASONE SODIUM PHOSPHATE 10 MG/ML IJ SOLN
INTRAMUSCULAR | Status: DC | PRN
  Administered 2016-03-29: 10 mg via INTRAVENOUS

## 2016-03-29 MED ORDER — SODIUM CHLORIDE 0.9 % IV SOLN: INTRAVENOUS | Status: DC

## 2016-03-29 MED ORDER — GLYCOPYRROLATE 0.2 MG/ML IJ SOLN: 0.2000 mg | Freq: Once | INTRAMUSCULAR | Status: DC | PRN

## 2016-03-29 MED ORDER — OXYCODONE HCL 5 MG PO TABS: 5.0000 mg | ORAL_TABLET | ORAL | 0 refills | Status: DC | PRN

## 2016-03-29 MED ORDER — LIDOCAINE HCL (CARDIAC) 20 MG/ML IV SOLN
INTRAVENOUS | Status: DC | PRN
  Administered 2016-03-29: 30 mg via INTRAVENOUS

## 2016-03-29 MED ORDER — MIDAZOLAM HCL 2 MG/2ML IJ SOLN
1.0000 mg | INTRAMUSCULAR | Status: DC | PRN
  Administered 2016-03-29 (×2): 2 mg via INTRAVENOUS

## 2016-03-29 MED ORDER — DOCUSATE SODIUM 100 MG PO CAPS: 100.0000 mg | ORAL_CAPSULE | Freq: Two times a day (BID) | ORAL | 0 refills | Status: DC

## 2016-03-29 MED ORDER — OXYCODONE HCL 5 MG PO TABS: 5.0000 mg | ORAL_TABLET | Freq: Once | ORAL | Status: DC | PRN

## 2016-03-29 MED ORDER — SCOPOLAMINE 1 MG/3DAYS TD PT72: 1.0000 | MEDICATED_PATCH | Freq: Once | TRANSDERMAL | Status: DC | PRN

## 2016-03-29 MED ORDER — OXYCODONE HCL 5 MG/5ML PO SOLN: 5.0000 mg | Freq: Once | ORAL | Status: DC | PRN

## 2016-03-29 MED ORDER — BUPIVACAINE-EPINEPHRINE (PF) 0.5% -1:200000 IJ SOLN
INTRAMUSCULAR | Status: DC | PRN
  Administered 2016-03-29 (×2): 10 mL via PERINEURAL

## 2016-03-29 MED ORDER — ONDANSETRON HCL 4 MG PO TABS: 4.0000 mg | ORAL_TABLET | Freq: Every day | ORAL | 0 refills | Status: DC | PRN

## 2016-03-29 MED ORDER — SODIUM CHLORIDE 0.9 % IR SOLN
Status: DC | PRN
  Administered 2016-03-29: 7000 mL

## 2016-03-29 MED ORDER — ONDANSETRON HCL 4 MG/2ML IJ SOLN: 4.0000 mg | Freq: Once | INTRAMUSCULAR | Status: DC | PRN

## 2016-03-29 MED ORDER — CHLORHEXIDINE GLUCONATE 4 % EX LIQD: 60.0000 mL | Freq: Once | CUTANEOUS | Status: DC

## 2016-03-29 SURGICAL SUPPLY — 98 items
BANDAGE ESMARK 6X9 LF (GAUZE/BANDAGES/DRESSINGS) ×1 IMPLANT
BLADE CUDA 2.0 (BLADE) IMPLANT
BLADE CUDA GRT WHITE 3.5 (BLADE) ×2 IMPLANT
BLADE CUDA SHAVER 3.5 (BLADE) IMPLANT
BLADE CUTTER GATOR 3.5 (BLADE) IMPLANT
BLADE GREAT WHITE 4.2 (BLADE) IMPLANT
BLADE GREAT WHITE 4.2MM (BLADE)
BLADE MICRO SAGITTAL (BLADE) IMPLANT
BLADE SURG 15 STRL LF DISP TIS (BLADE) ×2 IMPLANT
BLADE SURG 15 STRL SS (BLADE) ×6
BNDG CMPR 9X6 STRL LF SNTH (GAUZE/BANDAGES/DRESSINGS) ×1
BNDG COHESIVE 4X5 TAN STRL (GAUZE/BANDAGES/DRESSINGS) ×3 IMPLANT
BNDG COHESIVE 6X5 TAN STRL LF (GAUZE/BANDAGES/DRESSINGS) ×3 IMPLANT
BNDG ESMARK 6X9 LF (GAUZE/BANDAGES/DRESSINGS) ×3
BOOT STEPPER DURA LG (SOFTGOODS) IMPLANT
BOOT STEPPER DURA MED (SOFTGOODS) IMPLANT
BOOT STEPPER DURA SM (SOFTGOODS) IMPLANT
BUR 3.5 LG SPHERICAL (BURR) IMPLANT
BUR CUDA 2.9 (BURR) IMPLANT
BUR CUDA 2.9MM (BURR)
BUR FULL RADIUS 2.9 (BURR) IMPLANT
BUR FULL RADIUS 2.9MM (BURR)
BUR GATOR 2.9 (BURR) IMPLANT
BUR GATOR 2.9MM (BURR)
BUR OVAL 4.0 (BURR) ×2 IMPLANT
BUR SPHERICAL 2.9 (BURR) IMPLANT
BUR SPHERICAL 2.9MM (BURR)
BUR VERTEX HOODED 4.5 (BURR) IMPLANT
BURR 3.5 LG SPHERICAL (BURR)
BURR 3.5MM LG SPHERICAL (BURR)
CHLORAPREP W/TINT 26ML (MISCELLANEOUS) ×3 IMPLANT
CLOSURE WOUND 1/2 X4 (GAUZE/BANDAGES/DRESSINGS)
CUFF TOURNIQUET SINGLE 24IN (TOURNIQUET CUFF) ×2 IMPLANT
CUFF TOURNIQUET SINGLE 34IN LL (TOURNIQUET CUFF) ×1 IMPLANT
DRAPE EXTREMITY T 121X128X90 (DRAPE) ×3 IMPLANT
DRAPE INCISE IOBAN 66X45 STRL (DRAPES) IMPLANT
DRAPE OEC MINIVIEW 54X84 (DRAPES) ×3 IMPLANT
DRAPE U-SHAPE 47X51 STRL (DRAPES) ×3 IMPLANT
DRAPE UTILITY XL STRL (DRAPES) IMPLANT
DRSG MEPITEL 4X7.2 (GAUZE/BANDAGES/DRESSINGS) ×2 IMPLANT
DRSG PAD ABDOMINAL 8X10 ST (GAUZE/BANDAGES/DRESSINGS) ×6 IMPLANT
DRSG TEGADERM 2-3/8X2-3/4 SM (GAUZE/BANDAGES/DRESSINGS) IMPLANT
ELECT REM PT RETURN 9FT ADLT (ELECTROSURGICAL) ×3
ELECTRODE REM PT RTRN 9FT ADLT (ELECTROSURGICAL) ×1 IMPLANT
GAUZE SPONGE 4X4 12PLY STRL (GAUZE/BANDAGES/DRESSINGS) ×3 IMPLANT
GLOVE BIO SURGEON STRL SZ8 (GLOVE) ×3 IMPLANT
GLOVE BIOGEL PI IND STRL 7.0 (GLOVE) IMPLANT
GLOVE BIOGEL PI IND STRL 8 (GLOVE) ×2 IMPLANT
GLOVE BIOGEL PI INDICATOR 7.0 (GLOVE) ×4
GLOVE BIOGEL PI INDICATOR 8 (GLOVE) ×4
GLOVE ECLIPSE 6.5 STRL STRAW (GLOVE) ×2 IMPLANT
GLOVE ECLIPSE 7.5 STRL STRAW (GLOVE) ×3 IMPLANT
GLOVE EXAM NITRILE MD LF STRL (GLOVE) IMPLANT
GOWN STRL REUS W/ TWL LRG LVL3 (GOWN DISPOSABLE) ×1 IMPLANT
GOWN STRL REUS W/ TWL XL LVL3 (GOWN DISPOSABLE) ×2 IMPLANT
GOWN STRL REUS W/TWL LRG LVL3 (GOWN DISPOSABLE) ×3
GOWN STRL REUS W/TWL XL LVL3 (GOWN DISPOSABLE) ×6
IV NS IRRIG 3000ML ARTHROMATIC (IV SOLUTION) ×3 IMPLANT
KIT ASP BONE MRW 60CC (KITS) IMPLANT
MANIFOLD NEPTUNE II (INSTRUMENTS) ×3 IMPLANT
NS IRRIG 1000ML POUR BTL (IV SOLUTION) IMPLANT
PACK ARTHROSCOPY DSU (CUSTOM PROCEDURE TRAY) ×3 IMPLANT
PACK BASIN DAY SURGERY FS (CUSTOM PROCEDURE TRAY) ×3 IMPLANT
PAD CAST 4YDX4 CTTN HI CHSV (CAST SUPPLIES) ×1 IMPLANT
PADDING CAST ABS 4INX4YD NS (CAST SUPPLIES)
PADDING CAST ABS COTTON 4X4 ST (CAST SUPPLIES) IMPLANT
PADDING CAST COTTON 4X4 STRL (CAST SUPPLIES) ×3
PADDING CAST COTTON 6X4 STRL (CAST SUPPLIES) ×3 IMPLANT
PENCIL BUTTON HOLSTER BLD 10FT (ELECTRODE) ×3 IMPLANT
PIN GUIDE DRILL TIP 2.8X300 (DRILL) ×2 IMPLANT
SANITIZER HAND PURELL 535ML FO (MISCELLANEOUS) ×3 IMPLANT
SCREW CANNULATED 6.5X50 (Screw) ×2 IMPLANT
SCREW CANNULATED 6.5X55 KNEE (Screw) ×4 IMPLANT
SET IRRIG Y TYPE TUR BLADDER L (SET/KITS/TRAYS/PACK) ×3 IMPLANT
SLEEVE SCD COMPRESS KNEE MED (MISCELLANEOUS) ×3 IMPLANT
SPLINT FAST PLASTER 5X30 (CAST SUPPLIES) ×40
SPLINT PLASTER CAST FAST 5X30 (CAST SUPPLIES) ×20 IMPLANT
SPONGE GAUZE 2X2 8PLY STER LF (GAUZE/BANDAGES/DRESSINGS)
SPONGE GAUZE 2X2 8PLY STRL LF (GAUZE/BANDAGES/DRESSINGS) IMPLANT
SPONGE LAP 18X18 X RAY DECT (DISPOSABLE) ×3 IMPLANT
STOCKINETTE 6  STRL (DRAPES) ×2
STOCKINETTE 6 STRL (DRAPES) IMPLANT
STRAP ANKLE FOOT DISTRACTOR (ORTHOPEDIC SUPPLIES) ×2 IMPLANT
STRIP CLOSURE SKIN 1/2X4 (GAUZE/BANDAGES/DRESSINGS) IMPLANT
SUCTION FRAZIER HANDLE 10FR (MISCELLANEOUS) ×2
SUCTION TUBE FRAZIER 10FR DISP (MISCELLANEOUS) ×1 IMPLANT
SUT ETHILON 3 0 PS 1 (SUTURE) ×3 IMPLANT
SUT MNCRL AB 3-0 PS2 18 (SUTURE) ×3 IMPLANT
SUT VIC AB 0 CT1 27 (SUTURE)
SUT VIC AB 0 CT1 27XBRD ANBCTR (SUTURE) IMPLANT
SYR BULB 3OZ (MISCELLANEOUS) ×3 IMPLANT
TOWEL OR 17X24 6PK STRL BLUE (TOWEL DISPOSABLE) ×6 IMPLANT
TUBE CONNECTING 20'X1/4 (TUBING)
TUBE CONNECTING 20X1/4 (TUBING) IMPLANT
WAND STAR VAC 90 (SURGICAL WAND) IMPLANT
WASHER FLAT 6.5MM (Washer) ×2 IMPLANT
WATER STERILE IRR 1000ML POUR (IV SOLUTION) ×3 IMPLANT
YANKAUER SUCT BULB TIP NO VENT (SUCTIONS) IMPLANT

## 2016-03-29 NOTE — Anesthesia Procedure Notes (Signed)
Procedure Name: LMA Insertion Date/Time: 03/29/2016 7:39 AM Performed by: Daina Cara D Pre-anesthesia Checklist: Patient identified, Emergency Drugs available, Suction available and Patient being monitored Patient Re-evaluated:Patient Re-evaluated prior to inductionOxygen Delivery Method: Circle system utilized Preoxygenation: Pre-oxygenation with 100% oxygen Intubation Type: IV induction Ventilation: Mask ventilation without difficulty LMA: LMA inserted LMA Size: 4.0 Number of attempts: 1 Airway Equipment and Method: Bite block Placement Confirmation: positive ETCO2 Tube secured with: Tape Dental Injury: Teeth and Oropharynx as per pre-operative assessment

## 2016-03-29 NOTE — Discharge Instructions (Addendum)
Toni Arthurs, MD [facility]  Please read the following information regarding your care after surgery.  Medications  You only need a prescription for the narcotic pain medicine (ex. oxycodone, Percocet, Norco).  All of the other medicines listed below are available over the counter. X acetominophen (Tylenol) 650 mg every 4-6 hours as you need for minor pain X oxycodone as prescribed for moderate to severe pain ?   Narcotic pain medicine (ex. oxycodone, Percocet, Vicodin) will cause constipation.  To prevent this problem, take the following medicines while you are taking any pain medicine. X docusate sodium (Colace) 100 mg twice a day X senna (Senokot) 2 tablets twice a day  X To help prevent blood clots, take a baby aspirin (81 mg) twice a day after surgery until you are allowed to initiate weightbearing on your operative extremity.  You should also get up every hour while you are awake to move around.    Weight Bearing X Do not bear any weight on the operated leg or foot.  Cast / Splint / Dressing X Keep your splint or cast clean and dry.  Dont put anything (coat hanger, pencil, etc) down inside of it.  If it gets damp, use a hair dryer on the cool setting to dry it.  If it gets soaked, call the office to schedule an appointment for a cast change.    After your dressing, cast or splint is removed; you may shower, but do not soak or scrub the wound.  Allow the water to run over it, and then gently pat it dry.  Swelling It is normal for you to have swelling where you had surgery.  To reduce swelling and pain, keep your toes above your nose for at least 3 days after surgery.  It may be necessary to keep your foot or leg elevated for several weeks.  If it hurts, it should be elevated.  Follow Up Call my office at [phone] when you are discharged from the hospital or surgery center to schedule an appointment to be seen two weeks after surgery.  Call my office at  [phone] if you develop a fever >101.5 F, nausea, vomiting, bleeding from the surgical site or severe pain.    Regional Anesthesia Blocks  1. Numbness or the inability to move the "blocked" extremity may last from 3-48 hours after placement. The length of time depends on the medication injected and your individual response to the medication. If the numbness is not going away after 48 hours, call your surgeon.  2. The extremity that is blocked will need to be protected until the numbness is gone and the  Strength has returned. Because you cannot feel it, you will need to take extra care to avoid injury. Because it may be weak, you may have difficulty moving it or using it. You may not know what position it is in without looking at it while the block is in effect.  3. For blocks in the legs and feet, returning to weight bearing and walking needs to be done carefully. You will need to wait until the numbness is entirely gone and the strength has returned. You should be able to move your leg and foot normally before you try and bear weight or walk. You will need someone to be with you when you first try to ensure you do not fall and possibly risk injury.  4. Bruising and tenderness at the needle site are common side effects and will resolve in a few  days.  5. Persistent numbness or new problems with movement should be communicated to the surgeon or the [facility] [phone])/ [facility] [phone]).  Post Anesthesia Home Care Instructions  Activity: Get plenty of rest for the remainder of the day. A responsible adult should stay with you for 24 hours following the procedure.  For the next 24 hours, DO NOT: -Drive a car -Advertising copywriterperate machinery -Drink alcoholic beverages -Take any medication unless instructed by your physician -Make any legal decisions or sign important papers.  Meals: Start with liquid foods such as gelatin or soup. Progress to regular foods as  tolerated. Avoid greasy, spicy, heavy foods. If nausea and/or vomiting occur, drink only clear liquids until the nausea and/or vomiting subsides. Call your physician if vomiting continues.  Special Instructions/Symptoms: Your throat may feel dry or sore from the anesthesia or the breathing tube placed in your throat during surgery. If this causes discomfort, gargle with warm salt water. The discomfort should disappear within 24 hours.  If you had a scopolamine patch placed behind your ear for the management of post- operative nausea and/or vomiting:  1. The medication in the patch is effective for 72 hours, after which it should be removed.  Wrap patch in a tissue and discard in the trash. Wash hands thoroughly with soap and water. 2. You may remove the patch earlier than 72 hours if you experience unpleasant side effects which may include dry mouth, dizziness or visual disturbances. 3. Avoid touching the patch. Wash your hands with soap and water after contact with the patch.

## 2016-03-29 NOTE — Anesthesia Procedure Notes (Addendum)
Anesthesia Regional Block:  Adductor canal block  Pre-Anesthetic Checklist: ,, timeout performed, Correct Patient, Correct Site, Correct Laterality, Correct Procedure, Correct Position, site marked, Risks and benefits discussed,  Surgical consent,  Pre-op evaluation,  At surgeon's request and post-op pain management  Laterality: Right and Lower  Prep: chloraprep       Needles:  Injection technique: Single-shot  Needle Type: Echogenic Needle     Needle Length: 9cm 9 cm Needle Gauge: 21 and 21 G    Additional Needles:  Procedures: ultrasound guided (picture in chart) Adductor canal block Narrative:  Start time: 03/29/2016 7:24 AM End time: 03/29/2016 7:15 AM Injection made incrementally with aspirations every 5 mL.  Performed by: Personally  Anesthesiologist: Anhar Mcdermott

## 2016-03-29 NOTE — Transfer of Care (Signed)
Immediate Anesthesia Transfer of Care Note  Patient: Wendy Severericia D Allen  Procedure(s) Performed: Procedure(s): RIGHT ANKLE ARTHROSCOPY WITH ARTHRODESIS (Right)  Patient Location: PACU  Anesthesia Type:GA combined with regional for post-op pain  Level of Consciousness: awake and patient cooperative  Airway & Oxygen Therapy: Patient Spontanous Breathing and Patient connected to face mask oxygen  Post-op Assessment: Report given to RN and Post -op Vital signs reviewed and stable  Post vital signs: Reviewed and stable  Last Vitals:  Vitals:   03/29/16 0934 03/29/16 0935  BP: (!) 107/53   Pulse: 86 84  Resp:  15  Temp:      Last Pain:  Vitals:   03/29/16 0633  PainSc: 2          Complications: No apparent anesthesia complications

## 2016-03-29 NOTE — Progress Notes (Signed)
Assisted Dr. Crews with right, ultrasound guided, popliteal/saphenous block. Side rails up, monitors on throughout procedure. See vital signs in flow sheet. Tolerated Procedure well. 

## 2016-03-29 NOTE — Op Note (Signed)
NAMECHARMAGNE, BUHL NO.:  [id number]  MEDICAL RECORD NO.:  [id number]  LOCATION:                                 FACILITY:  PHYSICIAN:  Toni Arthurs, MD             DATE OF BIRTH:  DATE OF PROCEDURE:  03/29/2016 DATE OF DISCHARGE:                              OPERATIVE REPORT   POSTOPERATIVE DIAGNOSES:  Right ankle osteochondral lesion and arthritis.  POSTOPERATIVE DIAGNOSES:  Right ankle osteochondral lesion and arthritis.  PROCEDURE: 1. Right ankle arthroscopic arthrodesis. 2. AP, mortise, and lateral radiographs of the right ankle.  SURGEON:  Toni Arthurs, MD.  ASSISTANT:  Alfredo Martinez, PA-C.  ANESTHESIA:  General, regional.  ESTIMATED BLOOD LOSS:  Minimal.  TOURNIQUET TIME:  86 minutes, 250 mmHg.  COMPLICATIONS:  None apparent.  DISPOSITION:  Extubated, awake, and stable to recovery room.  INDICATION FOR PROCEDURE:  The patient is a 25 year old woman with a long history of right ankle pain.  She has a large osteochondral lesion of the medial talar dome and has undergone 2 previous ankle arthroscopies with microfracture.  She continues to have significant pain limitations of her activities.  She has failed nonoperative treatment to date and presents today for arthrodesis of the right ankle. She understands the risks and benefits of the alternative treatment options, and elects surgical treatment.  She understands specifically risks of bleeding, infection, nerve damage, blood clots, need for additional surgery, continued pain, nonunion, amputation, and death.  PROCEDURE IN DETAIL:  After preoperative consent was obtained and the correct operative site was identified, the patient was brought to the operating room and placed supine on the operating table.  General anesthesia was induced.  Preoperative antibiotics were administered. Surgical time-out was taken.  The right lower extremity was positioned in a well leg holder, and then prepped and  draped in standard sterile fashion with a tourniquet around the thigh.  A traction strap was applied to the foot and gentle traction was applied.  Foot was exsanguinated and the tourniquet was inflated to 250 mmHg.  The anteromedial arthroscopy portal was established and the arthroscope was inserted into the ankle joint.  An anterolateral arthroscopy portal was then established under direct vision using the Weston Brass And Spread technique.  The ankle joint was inspected.  There was diffuse degenerative changes in the cartilage noted as well as the osteochondral lesion and significant synovitis.  The articular cartilage on both sides of the joint was removed with curettes and shaver.  Once subchondral bone was exposed, a bur was used to remove the layer of subchondral bone exposing healthy medullary bone on both sides of the joint.  The joint was then reduced.  A guide pin for 6.5 mm cannulated Biomet screw was inserted from the posterolateral tibia across to the neck of the talus. An AP foot, lateral ankle, and AP ankle radiographs showed appropriate alignment of the joint and appropriate position of the guide pin.  The guide pin was then utilized to insert a cannulated 6.5 mm partially threaded screw with a washer.  It was tightened and noted to have excellent compression across the ankle joint.  A  K-wire was inserted from anterolateral to posteromedial just in front of the fibula, again through a small incision.  This screw was inserted and was noted to have excellent purchase.  A final screw was inserted from anteromedial to anterolateral.  This was again inserted through a small incision after x- rays confirmed appropriate alignment of the guide pin.  AP mortise and lateral radiographs confirmed appropriate position and length of all hardware and appropriate reduction of the tibiotalar joint.  The wounds were then irrigated and closed with nylon sutures.  Sterile dressings were applied  followed by a well-padded short-leg splint.  Tourniquet was released after application of the dressings at 86 minutes.  The patient was awakened from anesthesia and transported to the recovery room in stable condition.  FOLLOWUP PLAN:  The patient will be nonweightbearing on the right lower extremity.  She will follow up in the office in 2 weeks for suture removal and conversion to a cast.  RADIOGRAPHS:  AP, mortise, and lateral radiographs of the right ankle show interval arthrodesis of the tibiotalar joint.  Hardware is appropriately positioned and of the appropriate length.  No other acute injuries noted.  Alfredo MartinezJustin Ollis, PA-C, was present and scrubbed for the duration of the case.  His assistance was essential in positioning the patient, prepping and draping, gaining and maintaining exposure, performing the operation, closing and dressing the wounds, and applying the splint.     Toni ArthursJohn Enslee Bibbins, MD     JH/MEDQ  D:  03/29/2016  T:  03/29/2016  Job:  [id number]

## 2016-03-29 NOTE — Anesthesia Postprocedure Evaluation (Signed)
Anesthesia Post Note  Patient: Nelda Severericia D Knight  Procedure(s) Performed: Procedure(s) (LRB): RIGHT ANKLE ARTHROSCOPY WITH ARTHRODESIS (Right)  Patient location during evaluation: PACU Anesthesia Type: General Level of consciousness: awake and alert Pain management: pain level controlled Vital Signs Assessment: post-procedure vital signs reviewed and stable Respiratory status: spontaneous breathing, nonlabored ventilation and respiratory function stable Cardiovascular status: blood pressure returned to baseline and stable Postop Assessment: no signs of nausea or vomiting Anesthetic complications: no    Last Vitals:  Vitals:   03/29/16 1015 03/29/16 1040  BP: (!) 140/92 133/78  Pulse: 92 95  Resp: 18 16  Temp:  36.6 C    Last Pain:  Vitals:   03/29/16 1040  TempSrc: Oral  PainSc: 0-No pain                 Senica Crall A

## 2016-03-29 NOTE — Anesthesia Procedure Notes (Addendum)
Anesthesia Regional Block:  Popliteal block  Pre-Anesthetic Checklist: ,, timeout performed, Correct Patient, Correct Site, Correct Laterality, Correct Procedure, Correct Position, site marked, Risks and benefits discussed,  Surgical consent,  Pre-op evaluation,  At surgeon's request and post-op pain management  Laterality: Right and Lower  Prep: chloraprep       Needles:  Injection technique: Single-shot  Needle Type: Echogenic Needle     Needle Length: 9cm 9 cm Needle Gauge: 21 and 21 G    Additional Needles:  Procedures: ultrasound guided (picture in chart) Popliteal block Narrative:  Start time: 03/29/2016 7:07 AM End time: 03/29/2016 7:11 AM Injection made incrementally with aspirations every 5 mL.  Performed by: Personally  Anesthesiologist: Sunni Richardson

## 2016-03-29 NOTE — Brief Op Note (Signed)
03/29/2016  9:44 AM  PATIENT:  Wendy Allen  25 y.o. female  PRE-OPERATIVE DIAGNOSIS:  right ankle arthritis  POST-OPERATIVE DIAGNOSIS:  right ankle arthritis  Procedure(s): 1.  RIGHT ANKLE ARTHROSCOPY WITH ARTHRODESIS 2.  AP, mortise and lateral xrays of the right ankle  SURGEON:  Toni ArthursJohn Acasia Skilton, MD  ASSISTANT:  Alfredo MartinezJustin Ollis, PA-C  ANESTHESIA:   General, regional  EBL:  minimal   TOURNIQUET:   Total Tourniquet Time Documented: Thigh (Right) - 86 minutes Total: Thigh (Right) - 86 minutes  COMPLICATIONS:  None apparent  DISPOSITION:  Extubated, awake and stable to recovery.  DICTATION ID:

## 2016-03-29 NOTE — H&P (Signed)
Wendy Allen is an 25 y.o. female.   Chief Complaint: right ankle pain HPI: 25 y/o female with PMH of smoking c/o a long h/o right ankle pain.  She has had arthroscopy x 2 for a large osteochondral lesion.  She has persistent and progressive pain that is limiting her daily activities.  She has failed non op treatment and presents today for surgery.  Past Medical History:  Diagnosis Date  . 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 Complication of anesthesia    woke up crying after anesthesia  . Family history of adverse reaction to anesthesia    pt's mother has hx. of post-op N/V    Past Surgical History:  Procedure Laterality Date  . ANKLE ARTHROSCOPY Right 2008; 2016    Family History  Problem Relation Age of Onset  . Hypertension Father   . Hyperlipidemia Father   . Diabetes Paternal Grandmother   . Hyperlipidemia Paternal Grandmother   . Hypertension Paternal Grandmother   . Cancer Paternal Grandmother     skin   . Hypertension Paternal Grandfather   . Hyperlipidemia Paternal Grandfather   . Anesthesia problems Mother     post-op N/V   Social History:  reports that she quit smoking about 5 weeks ago. She smoked 0.00 packs per day for 8.00 years. She has never used smokeless tobacco. She reports that she does not drink alcohol or use drugs.  Allergies:  Allergies  Allergen Reactions  . Vicodin [Hydrocodone-Acetaminophen] Nausea And Vomiting  . Ortho Tri-Cyclen [Norgestimate-Eth Estradiol] Rash  . Tetracyclines & Related Rash    Medications Prior to Admission  Medication Sig Dispense Refill  . norgestimate-ethinyl estradiol (SPRINTEC 28) 0.25-35 MG-MCG tablet Take 1 tablet by mouth daily. 84 tablet 4    No results found for this or any previous visit (from the past 48 hour(s)). No results found.  ROS  No recent f/c/n/v/wt loss  Blood pressure (!) 110/57, pulse 71, temperature 98.3 F (36.8 C), resp. rate 18, height 5\' 10"  (1.778 m), weight 66.1 kg (145 lb 12.8  oz), last menstrual period 03/20/2016, SpO2 100 %. Physical Exam  wn wd woman in nad.  A and O x 4.  Mood and affect normal.  EOMI.  resp unlabored.  R ankle with healthy skin.  Surgical incisions well healed.  No signs of infection.  No lymphadenopathy.  2+ dp and pt pulses.  Sens to LT intact at the dorsal and plantar foot.  Assessment/Plan L ankle arthritis - to OR for left ankle arthroscopic arthrodesis.  The risks and benefits of the alternative treatment options have been discussed in detail.  The patient wishes to proceed with surgery and specifically understands risks of bleeding, infection, nerve damage, blood clots, need for additional surgery, amputation and death.   Wendy Allen, Wendy Ciesla, MD 03/29/2016, 7:19 AM

## 2016-03-29 NOTE — Anesthesia Preprocedure Evaluation (Signed)
Anesthesia Evaluation  Patient identified by MRN, date of birth, ID band Patient awake    Reviewed: Allergy & Precautions, NPO status , Patient's Chart, lab work & pertinent test results  Airway Mallampati: I  TM Distance: >3 FB Neck ROM: Full    Dental  (+) Teeth Intact, Dental Advisory Given   Pulmonary former smoker,    breath sounds clear to auscultation       Cardiovascular  Rhythm:Regular Rate:Normal     Neuro/Psych    GI/Hepatic   Endo/Other    Renal/GU      Musculoskeletal   Abdominal   Peds  Hematology   Anesthesia Other Findings   Reproductive/Obstetrics                             Anesthesia Physical Anesthesia Plan  ASA: I  Anesthesia Plan: General   Post-op Pain Management: GA combined w/ Regional for post-op pain   Induction: Intravenous  Airway Management Planned: LMA  Additional Equipment:   Intra-op Plan:   Post-operative Plan: Extubation in OR  Informed Consent: I have reviewed the patients History and Physical, chart, labs and discussed the procedure including the risks, benefits and alternatives for the proposed anesthesia with the patient or authorized representative who has indicated his/her understanding and acceptance.   Dental advisory given  Plan Discussed with: CRNA, Anesthesiologist and Surgeon  Anesthesia Plan Comments:         Anesthesia Quick Evaluation  

## 2016-03-30 ENCOUNTER — Encounter (HOSPITAL_BASED_OUTPATIENT_CLINIC_OR_DEPARTMENT_OTHER): Payer: Self-pay | Admitting: Orthopedic Surgery

## 2016-04-04 ENCOUNTER — Telehealth: Payer: Self-pay | Admitting: *Deleted

## 2016-04-04 NOTE — Telephone Encounter (Signed)
Pt says lymph node gone down and has had ankle fused recently

## 2016-04-04 NOTE — Telephone Encounter (Signed)
Pt states was to follow up with Wendy Allen for lump in breast, pt states lump is gone, she has had a recent surgery can not walk or drive, so unable to keep her f/u appt but will see Wendy Allen in September for annual exam.

## 2016-04-09 ENCOUNTER — Ambulatory Visit: Payer: BLUE CROSS/BLUE SHIELD | Admitting: Adult Health

## 2016-05-15 ENCOUNTER — Other Ambulatory Visit: Payer: BLUE CROSS/BLUE SHIELD | Admitting: Adult Health

## 2016-06-12 ENCOUNTER — Encounter: Payer: Self-pay | Admitting: Adult Health

## 2016-06-12 ENCOUNTER — Ambulatory Visit (INDEPENDENT_AMBULATORY_CARE_PROVIDER_SITE_OTHER): Payer: BLUE CROSS/BLUE SHIELD | Admitting: Adult Health

## 2016-06-12 VITALS — BP 110/60 | HR 76 | Ht 67.5 in | Wt 155.0 lb

## 2016-06-12 DIAGNOSIS — Z3041 Encounter for surveillance of contraceptive pills: Secondary | ICD-10-CM

## 2016-06-12 DIAGNOSIS — Z01419 Encounter for gynecological examination (general) (routine) without abnormal findings: Secondary | ICD-10-CM | POA: Diagnosis not present

## 2016-06-12 MED ORDER — NORGESTIMATE-ETH ESTRADIOL 0.25-35 MG-MCG PO TABS: 1.0000 | ORAL_TABLET | Freq: Every day | ORAL | 4 refills | Status: DC

## 2016-06-12 NOTE — Patient Instructions (Signed)
Physical in 1 year Pap in 2019  

## 2016-06-12 NOTE — Progress Notes (Signed)
Patient ID: Wendy Allen, female   DOB: 02/25/1991, 25 y.o.   MRN: [id number] History of Present Illness: Wendy Allen is a 25 year old white female in for a well woman gyn exam and get OCs refilled.Had normal pap 05/12/15.    Current Medications, Allergies, Past Medical History, Past Surgical History, Family History and Social History were reviewed in [facility] electronic medical record.     Review of Systems: Patient denies any headaches, hearing loss, fatigue, blurred vision, shortness of breath, chest pain, abdominal pain, problems with bowel movements, urination, or intercourse. No joint pain or mood swings.    Physical Exam:BP 110/60 (BP Location: Left Arm, Patient Position: Sitting, Cuff Size: Normal)   Pulse 76   Ht 5' 7.5" (1.715 m)   Wt 155 lb (70.3 kg)   LMP 05/15/2016 (Approximate)   BMI 23.92 kg/m  General:  Well developed, well nourished, no acute distress Skin:  Warm and dry Neck:  Midline trachea, normal thyroid, good ROM, no lymphadenopathy Lungs; Clear to auscultation bilaterally Breast:  No dominant palpable mass, retraction, or nipple discharge Cardiovascular: Regular rate and rhythm Abdomen:  Soft, non tender, no hepatosplenomegaly Pelvic:  External genitalia is normal in appearance, no lesions.  The vagina is normal in appearance. Urethra has no lesions or masses. The cervix is smooth.  Uterus is felt to be normal size, shape, and contour.  No adnexal masses or tenderness noted.Bladder is non tender, no masses felt. Extremities/musculoskeletal:  No swelling or varicosities noted, no clubbing or cyanosis Psych:  No mood changes, alert and cooperative,seems happy PHQ 2 score 0.  Impression:  1. Well woman exam with routine gynecological exam   2. Encounter for surveillance of contraceptive pills      Plan: Check CBC,CMP,TSH and lipids,A1c and vitamin D Refilled sprintec take 1 daily x 1 year  Physical in 1 year, pap in 2019

## 2016-06-13 LAB — COMPREHENSIVE METABOLIC PANEL
A/G RATIO: 1.5 (ref 1.2–2.2)
ALK PHOS: 67 IU/L (ref 39–117)
ALT: 5 IU/L (ref 0–32)
AST: 7 IU/L (ref 0–40)
Albumin: 4.1 g/dL (ref 3.5–5.5)
BILIRUBIN TOTAL: 0.3 mg/dL (ref 0.0–1.2)
BUN/Creatinine Ratio: 11 (ref 9–23)
BUN: 9 mg/dL (ref 6–20)
CALCIUM: 9.5 mg/dL (ref 8.7–10.2)
CO2: 23 mmol/L (ref 18–29)
Chloride: 101 mmol/L (ref 96–106)
Creatinine, Ser: 0.81 mg/dL (ref 0.57–1.00)
GFR calc Af Amer: 115 mL/min/{1.73_m2} (ref >59)
GFR, EST NON AFRICAN AMERICAN: 100 mL/min/{1.73_m2} (ref >59)
GLOBULIN, TOTAL: 2.7 g/dL (ref 1.5–4.5)
Glucose: 85 mg/dL (ref 65–99)
POTASSIUM: 4.4 mmol/L (ref 3.5–5.2)
SODIUM: 138 mmol/L (ref 134–144)
Total Protein: 6.8 g/dL (ref 6.0–8.5)

## 2016-06-13 LAB — TSH: TSH: 1.55 u[IU]/mL (ref 0.450–4.500)

## 2016-06-13 LAB — LIPID PANEL
CHOLESTEROL TOTAL: 257 mg/dL — AB (ref 100–199)
Chol/HDL Ratio: 6.1 ratio units — ABNORMAL HIGH (ref 0.0–4.4)
HDL: 42 mg/dL (ref >39)
LDL CALC: 146 mg/dL — AB (ref 0–99)
Triglycerides: 346 mg/dL — ABNORMAL HIGH (ref 0–149)
VLDL Cholesterol Cal: 69 mg/dL — ABNORMAL HIGH (ref 5–40)

## 2016-06-13 LAB — CBC
HEMATOCRIT: 39.5 % (ref 34.0–46.6)
Hemoglobin: 13.5 g/dL (ref 11.1–15.9)
MCH: 30.4 pg (ref 26.6–33.0)
MCHC: 34.2 g/dL (ref 31.5–35.7)
MCV: 89 fL (ref 79–97)
Platelets: 251 10*3/uL (ref 150–379)
RBC: 4.44 x10E6/uL (ref 3.77–5.28)
RDW: 12.7 % (ref 12.3–15.4)
WBC: 8.4 10*3/uL (ref 3.4–10.8)

## 2016-06-13 LAB — HEMOGLOBIN A1C
ESTIMATED AVERAGE GLUCOSE: 94 mg/dL
Hgb A1c MFr Bld: 4.9 % (ref 4.8–5.6)

## 2016-06-13 LAB — VITAMIN D 25 HYDROXY (VIT D DEFICIENCY, FRACTURES): VIT D 25 HYDROXY: 25.9 ng/mL — AB (ref 30.0–100.0)

## 2016-06-18 ENCOUNTER — Encounter: Payer: Self-pay | Admitting: Adult Health

## 2016-06-18 ENCOUNTER — Telehealth: Payer: Self-pay | Admitting: Adult Health

## 2016-06-18 DIAGNOSIS — E785 Hyperlipidemia, unspecified: Secondary | ICD-10-CM

## 2016-06-18 DIAGNOSIS — E559 Vitamin D deficiency, unspecified: Secondary | ICD-10-CM

## 2016-06-18 HISTORY — DX: Vitamin D deficiency, unspecified: E55.9

## 2016-06-18 HISTORY — DX: Hyperlipidemia, unspecified: E78.5

## 2016-06-18 MED ORDER — FENOFIBRATE 54 MG PO TABS: 54.0000 mg | ORAL_TABLET | Freq: Every day | ORAL | 2 refills | Status: DC

## 2016-06-18 MED ORDER — CHOLECALCIFEROL 125 MCG (5000 UT) PO CAPS: 5000.0000 [IU] | ORAL_CAPSULE | Freq: Every day | ORAL | Status: DC

## 2016-06-18 NOTE — Telephone Encounter (Signed)
Pt aware of labs and need to get trig and cholesterol down and vitamin D up ,will rx tricor 54 mg daily and recheck labs in 3 months(lipids and CMP and vitamin D)and take 5000 IU daily

## 2016-08-20 HISTORY — PX: ANKLE SURGERY: SHX546

## 2016-09-24 ENCOUNTER — Other Ambulatory Visit: Payer: BLUE CROSS/BLUE SHIELD

## 2016-09-24 ENCOUNTER — Other Ambulatory Visit: Payer: Self-pay | Admitting: Adult Health

## 2016-09-25 ENCOUNTER — Telehealth: Payer: Self-pay | Admitting: Adult Health

## 2016-09-25 LAB — LIPID PANEL
CHOLESTEROL TOTAL: 265 mg/dL — AB (ref 100–199)
Chol/HDL Ratio: 5.1 ratio units — ABNORMAL HIGH (ref 0.0–4.4)
HDL: 52 mg/dL (ref >39)
LDL Calculated: 170 mg/dL — ABNORMAL HIGH (ref 0–99)
TRIGLYCERIDES: 215 mg/dL — AB (ref 0–149)
VLDL Cholesterol Cal: 43 mg/dL — ABNORMAL HIGH (ref 5–40)

## 2016-09-25 LAB — VITAMIN D 25 HYDROXY (VIT D DEFICIENCY, FRACTURES): VIT D 25 HYDROXY: 25.9 ng/mL — AB (ref 30.0–100.0)

## 2016-09-25 LAB — COMPREHENSIVE METABOLIC PANEL
A/G RATIO: 1.9 (ref 1.2–2.2)
ALK PHOS: 64 IU/L (ref 39–117)
ALT: 11 IU/L (ref 0–32)
AST: 8 IU/L (ref 0–40)
Albumin: 4.4 g/dL (ref 3.5–5.5)
BILIRUBIN TOTAL: 0.2 mg/dL (ref 0.0–1.2)
BUN/Creatinine Ratio: 14 (ref 9–23)
BUN: 10 mg/dL (ref 6–20)
CHLORIDE: 103 mmol/L (ref 96–106)
CO2: 23 mmol/L (ref 18–29)
Calcium: 9.3 mg/dL (ref 8.7–10.2)
Creatinine, Ser: 0.7 mg/dL (ref 0.57–1.00)
GFR calc non Af Amer: 119 mL/min/{1.73_m2} (ref >59)
GFR, EST AFRICAN AMERICAN: 137 mL/min/{1.73_m2} (ref >59)
GLUCOSE: 87 mg/dL (ref 65–99)
Globulin, Total: 2.3 g/dL (ref 1.5–4.5)
POTASSIUM: 4.7 mmol/L (ref 3.5–5.2)
Sodium: 139 mmol/L (ref 134–144)
Total Protein: 6.7 g/dL (ref 6.0–8.5)

## 2016-09-25 NOTE — Telephone Encounter (Signed)
Left message that vitamin D still low and cholesterol and trig.still not good, are you taking meds and watching diet, call me

## 2016-09-26 ENCOUNTER — Other Ambulatory Visit: Payer: Self-pay | Admitting: Adult Health

## 2016-09-26 ENCOUNTER — Telehealth: Payer: Self-pay | Admitting: Adult Health

## 2016-09-26 NOTE — Telephone Encounter (Signed)
Pt had not been taking meds, so will take daily now and recheck labs in 3 months, placed in recall.will check CMP,lipids and vitamin D

## 2016-12-31 ENCOUNTER — Other Ambulatory Visit: Payer: Self-pay | Admitting: Adult Health

## 2017-01-04 ENCOUNTER — Telehealth: Payer: Self-pay | Admitting: *Deleted

## 2017-01-04 DIAGNOSIS — E559 Vitamin D deficiency, unspecified: Secondary | ICD-10-CM

## 2017-01-04 DIAGNOSIS — E7800 Pure hypercholesterolemia, unspecified: Secondary | ICD-10-CM

## 2017-01-04 NOTE — Telephone Encounter (Signed)
Will put labs in for CMP, cholesterol and vitaminD

## 2017-01-08 ENCOUNTER — Telehealth: Payer: Self-pay | Admitting: Adult Health

## 2017-01-08 LAB — COMPREHENSIVE METABOLIC PANEL
ALT: 14 IU/L (ref 0–32)
AST: 11 IU/L (ref 0–40)
Albumin/Globulin Ratio: 1.8 (ref 1.2–2.2)
Albumin: 4.4 g/dL (ref 3.5–5.5)
Alkaline Phosphatase: 56 IU/L (ref 39–117)
BUN/Creatinine Ratio: 14 (ref 9–23)
BUN: 12 mg/dL (ref 6–20)
Bilirubin Total: 0.5 mg/dL (ref 0.0–1.2)
CO2: 21 mmol/L (ref 18–29)
Calcium: 10.1 mg/dL (ref 8.7–10.2)
Chloride: 101 mmol/L (ref 96–106)
Creatinine, Ser: 0.86 mg/dL (ref 0.57–1.00)
GFR calc Af Amer: 107 mL/min/{1.73_m2} (ref >59)
GFR calc non Af Amer: 93 mL/min/{1.73_m2} (ref >59)
Globulin, Total: 2.4 g/dL (ref 1.5–4.5)
Glucose: 88 mg/dL (ref 65–99)
Potassium: 4.9 mmol/L (ref 3.5–5.2)
Sodium: 137 mmol/L (ref 134–144)
Total Protein: 6.8 g/dL (ref 6.0–8.5)

## 2017-01-08 LAB — LIPID PANEL
Chol/HDL Ratio: 4.7 ratio — ABNORMAL HIGH (ref 0.0–4.4)
Cholesterol, Total: 242 mg/dL — ABNORMAL HIGH (ref 100–199)
HDL: 51 mg/dL (ref >39)
LDL Calculated: 142 mg/dL — ABNORMAL HIGH (ref 0–99)
Triglycerides: 247 mg/dL — ABNORMAL HIGH (ref 0–149)
VLDL Cholesterol Cal: 49 mg/dL — ABNORMAL HIGH (ref 5–40)

## 2017-01-08 LAB — VITAMIN D 25 HYDROXY (VIT D DEFICIENCY, FRACTURES): Vit D, 25-Hydroxy: 55.7 ng/mL (ref 30.0–100.0)

## 2017-01-08 MED ORDER — VITAMIN D3 75 MCG (3000 UT) PO TABS: ORAL_TABLET | ORAL | Status: DC

## 2017-01-08 MED ORDER — FENOFIBRATE 54 MG PO TABS: 54.0000 mg | ORAL_TABLET | Freq: Every day | ORAL | 5 refills | Status: DC

## 2017-01-08 NOTE — Telephone Encounter (Signed)
Pt aware of labs, can decrease vitamin D3 to 2000-3000 IU daily and continue fenfibrate, increase exercise and decrease carbs and recheck labs in 6 months, placed in recall.

## 2017-01-15 ENCOUNTER — Other Ambulatory Visit: Payer: Self-pay | Admitting: Adult Health

## 2017-04-05 ENCOUNTER — Other Ambulatory Visit: Payer: Self-pay | Admitting: Orthopedic Surgery

## 2017-04-05 DIAGNOSIS — M25571 Pain in right ankle and joints of right foot: Principal | ICD-10-CM

## 2017-04-05 DIAGNOSIS — G8929 Other chronic pain: Secondary | ICD-10-CM

## 2017-04-09 ENCOUNTER — Other Ambulatory Visit: Payer: 59

## 2017-04-24 ENCOUNTER — Other Ambulatory Visit: Payer: Self-pay | Admitting: Orthopedic Surgery

## 2017-04-24 DIAGNOSIS — G8929 Other chronic pain: Secondary | ICD-10-CM

## 2017-04-24 DIAGNOSIS — M25571 Pain in right ankle and joints of right foot: Principal | ICD-10-CM

## 2017-05-02 ENCOUNTER — Ambulatory Visit
Admission: RE | Admit: 2017-05-02 | Discharge: 2017-05-02 | Disposition: A | Discharge status: Home / Self Care | Payer: BLUE CROSS/BLUE SHIELD | Source: Ambulatory Visit | Attending: Orthopedic Surgery | Admitting: Orthopedic Surgery

## 2017-05-02 DIAGNOSIS — M25571 Pain in right ankle and joints of right foot: Principal | ICD-10-CM

## 2017-05-02 DIAGNOSIS — G8929 Other chronic pain: Secondary | ICD-10-CM

## 2017-07-26 ENCOUNTER — Other Ambulatory Visit: Payer: Self-pay | Admitting: Women's Health

## 2017-07-26 ENCOUNTER — Other Ambulatory Visit: Payer: Self-pay | Admitting: Adult Health

## 2017-07-30 ENCOUNTER — Other Ambulatory Visit: Payer: Self-pay | Admitting: Adult Health

## 2017-08-26 ENCOUNTER — Telehealth: Payer: Self-pay | Admitting: Adult Health

## 2017-08-26 MED ORDER — NORGESTIMATE-ETH ESTRADIOL 0.25-35 MG-MCG PO TABS: 1.0000 | ORAL_TABLET | Freq: Every day | ORAL | 0 refills | Status: DC

## 2017-08-26 MED ORDER — FENOFIBRATE 54 MG PO TABS: ORAL_TABLET | ORAL | 1 refills | Status: DC

## 2017-08-26 NOTE — Telephone Encounter (Signed)
meds refilled 

## 2017-08-26 NOTE — Telephone Encounter (Signed)
Patient called stating that she was needs a refill of her medication, pt states that she ran out a month ago. Pt scheduled a pap and physical for 2/18 and would like a refill until then. Please contact pt

## 2017-08-26 NOTE — Telephone Encounter (Signed)
Pt is requesting a refill on her Sprintec and fenofibrate. She states that she made an appt for a physical in February but needs refills on the medications before that. [facility] pt [facility] and she should check with her pharmacy in the next 24 hours. Pt verbalized understanding.

## 2017-10-07 ENCOUNTER — Other Ambulatory Visit (HOSPITAL_COMMUNITY)
Admission: RE | Admit: 2017-10-07 | Discharge: 2017-10-07 | Disposition: A | Discharge status: Home / Self Care | Payer: BLUE CROSS/BLUE SHIELD | Source: Ambulatory Visit | Attending: Obstetrics & Gynecology | Admitting: Obstetrics & Gynecology

## 2017-10-07 ENCOUNTER — Ambulatory Visit (INDEPENDENT_AMBULATORY_CARE_PROVIDER_SITE_OTHER): Payer: BLUE CROSS/BLUE SHIELD | Admitting: Adult Health

## 2017-10-07 ENCOUNTER — Encounter: Payer: Self-pay | Admitting: Adult Health

## 2017-10-07 VITALS — BP 94/62 | HR 82 | Ht 68.25 in | Wt 155.0 lb

## 2017-10-07 DIAGNOSIS — Z3041 Encounter for surveillance of contraceptive pills: Secondary | ICD-10-CM | POA: Diagnosis not present

## 2017-10-07 DIAGNOSIS — Z01419 Encounter for gynecological examination (general) (routine) without abnormal findings: Secondary | ICD-10-CM | POA: Diagnosis present

## 2017-10-07 DIAGNOSIS — E7800 Pure hypercholesterolemia, unspecified: Secondary | ICD-10-CM

## 2017-10-07 DIAGNOSIS — E559 Vitamin D deficiency, unspecified: Secondary | ICD-10-CM | POA: Diagnosis not present

## 2017-10-07 DIAGNOSIS — R5383 Other fatigue: Secondary | ICD-10-CM | POA: Diagnosis not present

## 2017-10-07 DIAGNOSIS — Z01411 Encounter for gynecological examination (general) (routine) with abnormal findings: Secondary | ICD-10-CM | POA: Diagnosis not present

## 2017-10-07 MED ORDER — FENOFIBRATE 54 MG PO TABS: ORAL_TABLET | ORAL | 12 refills | Status: DC

## 2017-10-07 MED ORDER — NORGESTIMATE-ETH ESTRADIOL 0.25-35 MG-MCG PO TABS: 1.0000 | ORAL_TABLET | Freq: Every day | ORAL | 4 refills | Status: DC

## 2017-10-07 NOTE — Progress Notes (Signed)
Patient ID: Nelda Severericia D Knight, female   DOB: 08/28/1990, 27 y.o.   MRN: [id number] History of Present Illness: Nicki Guadalajararicia is a 27 year old white female in for a well woman gyn exam and pap. No PCP at this time.   Current Medications, Allergies, Past Medical History, Past Surgical History, Family History and Social History were reviewed in [facility] electronic medical record.     Review of Systems: Patient denies any headaches, hearing loss, blurred vision, shortness of breath, chest pain, abdominal pain, problems with bowel movements, urination, or intercourse. No joint pain or mood swings. Has not been on fenofibrate regularly, will wait and check labs in 3 months. She is happy with her OCs. +tired but has not a day off for over a month.     Physical Exam:BP 94/62 (BP Location: Left Arm, Patient Position: Sitting, Cuff Size: Normal)   Pulse 82   Ht 5' 8.25" (1.734 m)   Wt 155 lb (70.3 kg)   LMP 09/24/2017   BMI 23.40 kg/m  General:  Well developed, well nourished, no acute distress Skin:  Warm and dry Neck:  Midline trachea, normal thyroid, good ROM, no lymphadenopathy Lungs; Clear to auscultation bilaterally Breast:  No dominant palpable mass, retraction, or nipple discharge Cardiovascular: Regular rate and rhythm Abdomen:  Soft, non tender, no hepatosplenomegaly Pelvic:  External genitalia is normal in appearance, no lesions.  The vagina is normal in appearance. Urethra has no lesions or masses. The cervix is smooth and friable with EC brush, pap with reflex HPV performed.   Uterus is felt to be normal size, shape, and contour.  No adnexal masses or tenderness noted.Bladder is non tender, no masses felt. Extremities/musculoskeletal:  No swelling or varicosities noted, no clubbing or cyanosis Psych:  No mood changes, alert and cooperative,seems happy PHQ 2 score 0. She declines STD testing today.   Impression: 1. Encounter for gynecological examination with Papanicolaou smear of  cervix   2. Encounter for surveillance of contraceptive pills   3. Elevated cholesterol   4. Vitamin D deficiency       Plan: Meds ordered this encounter  Medications  . norgestimate-ethinyl estradiol (SPRINTEC 28) 0.25-35 MG-MCG tablet    Sig: Take 1 tablet by mouth daily.    Dispense:  84 tablet    Refill:  4    Order Specific Question:   Supervising Provider    Answer:   Despina HiddenEURE, LUTHER H [[id number]]  . fenofibrate 54 MG tablet    Sig: TAKE (1) TABLET BY MOUTH ONCE DAILY.    Dispense:  30 tablet    Refill:  12    Order Specific Question:   Supervising Provider    Answer:   Duane LopeEURE, LUTHER H [[id number]]  Continue Vitamin D Return in 3 months for fasting labs (CBC,CMP,TSH, lipids and vitamin D) Physical in 1 year Pap in 3 if normal Mammogram at 38

## 2017-10-09 LAB — CYTOLOGY - PAP: Diagnosis: NEGATIVE

## 2018-07-14 DIAGNOSIS — M84374A Stress fracture, right foot, initial encounter for fracture: Secondary | ICD-10-CM | POA: Insufficient documentation

## 2018-09-01 ENCOUNTER — Other Ambulatory Visit: Payer: Self-pay | Admitting: Adult Health

## 2018-11-20 ENCOUNTER — Other Ambulatory Visit: Payer: Self-pay | Admitting: Adult Health

## 2018-12-29 ENCOUNTER — Encounter: Payer: Self-pay | Admitting: Internal Medicine

## 2019-01-06 ENCOUNTER — Other Ambulatory Visit: Payer: Self-pay | Admitting: Adult Health

## 2019-01-16 ENCOUNTER — Ambulatory Visit: Payer: BLUE CROSS/BLUE SHIELD | Admitting: Podiatry

## 2019-01-16 ENCOUNTER — Other Ambulatory Visit: Payer: Self-pay

## 2019-01-16 ENCOUNTER — Telehealth: Payer: Self-pay | Admitting: *Deleted

## 2019-01-16 VITALS — Temp 97.7°F

## 2019-01-16 DIAGNOSIS — M79676 Pain in unspecified toe(s): Secondary | ICD-10-CM

## 2019-01-16 DIAGNOSIS — L600 Ingrowing nail: Secondary | ICD-10-CM

## 2019-01-16 MED ORDER — NEOMYCIN-POLYMYXIN-HC 3.5-10000-1 OT SOLN: OTIC | 0 refills | Status: DC

## 2019-01-16 NOTE — Progress Notes (Signed)
Subjective:  Patient ID: Wendy Allen, female    DOB: April 23, 1991,  MRN: [id number]  Chief Complaint  Patient presents with  . Nail Problem    Pt states right foot 1st nail lateral border is ingrown, 2wk duration. Pt states no drainage but it is sore.   28 y.o. female presents with the above complaint.   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  Review of Systems: Negative except as noted in the HPI. Denies N/V/F/Ch.  Past Medical History:  Diagnosis Date  . 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 Complication of anesthesia    woke up crying after anesthesia  . Dyslipidemia 06/18/2016  . Family history of adverse reaction to anesthesia    pt's mother has hx. of post-op N/V  . Vitamin D deficiency 06/18/2016    Current Outpatient Medications:  .  butalbital-acetaminophen-caffeine (FIORICET) 50-325-40 MG tablet, butalbital-acetaminophen-caffeine 50 mg-325 mg-40 mg tablet, Disp: , Rfl:  .  Cholecalciferol (VITAMIN D3) 3000 units TABS, Take 1 daily (Patient taking differently: Take 1 every other day), Disp: 30 tablet, Rfl:  .  cyclobenzaprine (FLEXERIL) 10 MG tablet, cyclobenzaprine 10 mg tablet, Disp: , Rfl:  .  diclofenac (VOLTAREN) 75 MG EC tablet, diclofenac sodium 75 mg tablet,delayed release, Disp: , Rfl:  .  fenofibrate 160 MG tablet, , Disp: , Rfl:  .  fenofibrate 54 MG tablet, TAKE (1) TABLET BY MOUTH ONCE DAILY., Disp: 30 tablet, Rfl: 0 .  ibuprofen (ADVIL,MOTRIN) 200 MG tablet, Take 800 mg by mouth as needed., Disp: , Rfl:  .  meloxicam (MOBIC) 15 MG tablet, , Disp: , Rfl:  .  predniSONE (DELTASONE) 10 MG tablet, , Disp: , Rfl:  .  SPRINTEC 28 0.25-35 MG-MCG tablet, TAKE ONE TABLET BY MOUTH ONCE DAILY., Disp: 84 tablet, Rfl: 0 .  traMADol (ULTRAM) 50 MG tablet, Take by mouth every 6 (six) hours as needed., Disp: , Rfl:  .  neomycin-polymyxin-hydrocortisone (CORTISPORIN) OTIC solution, Apply 2 drops to the ingrown  toenail site twice daily. Cover with band-aid., Disp: 10 mL, Rfl: 0  Social History   Tobacco Use  Smoking Status Former Smoker  . Packs/day: 0.00  . Years: 8.00  . Pack years: 0.00  . Types: Cigarettes  . Last attempt to quit: 02/23/2016  . Years since quitting: 2.8  Smokeless Tobacco Never Used    Allergies  Allergen Reactions  . Vicodin [Hydrocodone-Acetaminophen] Nausea And Vomiting  . Ortho Tri-Cyclen [Norgestimate-Eth Estradiol] Rash  . Tetracyclines & Related Rash   Objective:   Vitals:   01/16/19 1136  Temp: 97.7 F (36.5 C)   There is no height or weight on file to calculate BMI. Constitutional Well developed. Well nourished.  Vascular Dorsalis pedis pulses palpable bilaterally. Posterior tibial pulses palpable bilaterally. Capillary refill normal to all digits.  No cyanosis or clubbing noted. Pedal hair growth normal.  Neurologic Normal speech. Oriented to person, place, and time. Epicritic sensation to light touch grossly present bilaterally.  Dermatologic Painful ingrowing nail at lateral nail borders of the hallux nail right. No other open wounds. No skin lesions.  Orthopedic: Normal joint ROM without pain or crepitus bilaterally. No visible deformities. No bony tenderness.   Radiographs: None Assessment:   1. Ingrown nail   2. Pain around toenail    Plan:  Patient was evaluated and treated and all questions answered.  Ingrown Nail, right -Patient elects to proceed with minor surgery to remove  ingrown toenail removal today. Consent reviewed and signed by patient. -Ingrown nail excised. See procedure note. -Educated on post-procedure care including soaking. Written instructions provided and reviewed. -Patient to follow up in 2 weeks for nail check.  Procedure: Excision of Ingrown Toenail Location: Right 1st lateral nail borders. Anesthesia: Lidocaine 1% plain; 1.5 mL and Marcaine 0.5% plain; 1.5 mL, digital block. Skin Prep: Betadine.  Dressing: Silvadene; telfa; dry, sterile, compression dressing. Technique: Following skin prep, the toe was exsanguinated and a tourniquet was secured at the base of the toe. The affected nail border was freed, split with a nail splitter, and excised. Chemical matrixectomy was then performed with phenol and irrigated out with alcohol. The tourniquet was then removed and sterile dressing applied. Disposition: Patient tolerated procedure well. Patient to return in 2 weeks for follow-up.   Return in about 2 weeks (around 01/30/2019) for Nail Check.

## 2019-01-16 NOTE — Patient Instructions (Signed)

## 2019-01-16 NOTE — Telephone Encounter (Signed)
Pt complained of feeling hot, and lightheaded and faint after putting on her mask. Terrilee Croak, CMA, and Reyne Dumas, CMA gave pt water and sat her down in the lobby. I came to take her BP after getting off the phone with a pt. BP - 118/74. Pt stated she feels better and this is why she only puts her mask on when she has to be with other people.

## 2019-01-30 ENCOUNTER — Ambulatory Visit (INDEPENDENT_AMBULATORY_CARE_PROVIDER_SITE_OTHER): Payer: BC Managed Care – PPO | Admitting: Podiatry

## 2019-01-30 ENCOUNTER — Encounter: Payer: Self-pay | Admitting: Podiatry

## 2019-01-30 ENCOUNTER — Other Ambulatory Visit: Payer: Self-pay

## 2019-01-30 VITALS — Temp 98.6°F

## 2019-01-30 DIAGNOSIS — L600 Ingrowing nail: Secondary | ICD-10-CM | POA: Diagnosis not present

## 2019-01-30 DIAGNOSIS — M79676 Pain in unspecified toe(s): Secondary | ICD-10-CM | POA: Diagnosis not present

## 2019-02-01 NOTE — Progress Notes (Signed)
  Subjective:  Patient ID: Wendy Allen, female    DOB: [birth year],  MRN: [id number]  Chief Complaint  Patient presents with  . Nail Problem    my right big toenail is doing good   28 y.o. female returns for the above complaint. Nail is doing well no pain.  Objective:   General AA&O x3. Normal mood and affect.  Vascular Foot warm and well perfused with good capillary refill.  Neurologic Sensation grossly intact.  Dermatologic Nail avulsion site healing well without drainage or erythema. Nail bed with overlying soft crust. Left intact. No signs of local infection.  Orthopedic: No tenderness to palpation of the toe.   Assessment & Plan:  Patient was evaluated and treated and all questions answered.  S/p Ingrown Toenail Excision, right -Healing well without issue. -Discussed return precautions. -F/u PRN

## 2019-02-08 IMAGING — CT CT ANKLE*R* W/O CM
1 series · 12 of 14 positions shown, 15 images · non-contrast
Comparison: 10/14/2001 CT

CLINICAL DATA: Chronic right ankle pain. Evaluate hardware status
post fusion x1 year ago.

EXAM:
CT OF THE RIGHT ANKLE WITHOUT CONTRAST
TECHNIQUE: Multidetector CT imaging of the right ankle was performed according
to the standard protocol. Multiplanar CT image reconstructions were
also generated.

[Series 4: soft tissue lower extremity (person_name) · axial · 0.30mm/px · z∈[-226,-72]mm · 12 of 91 slices shown, 15 images]
[im 7/91  soft-tissue]
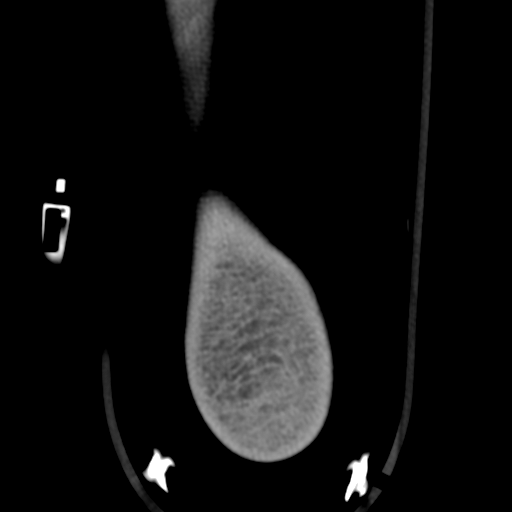
[im 7/91  bone]
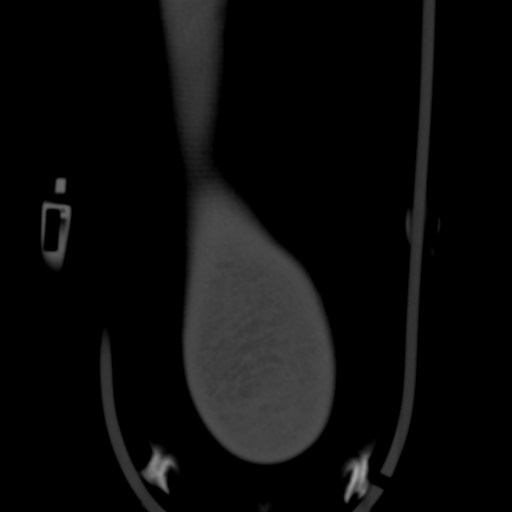
[im 14/91  bone]
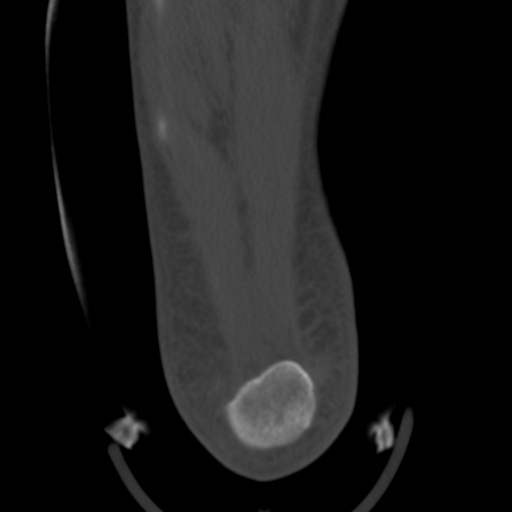
[im 21/91  bone]
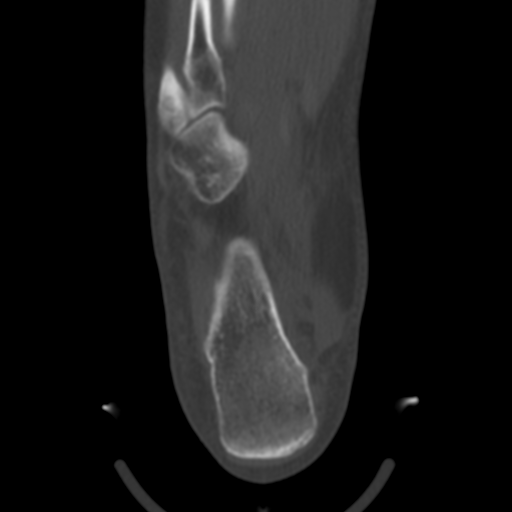
[im 28/91  bone]
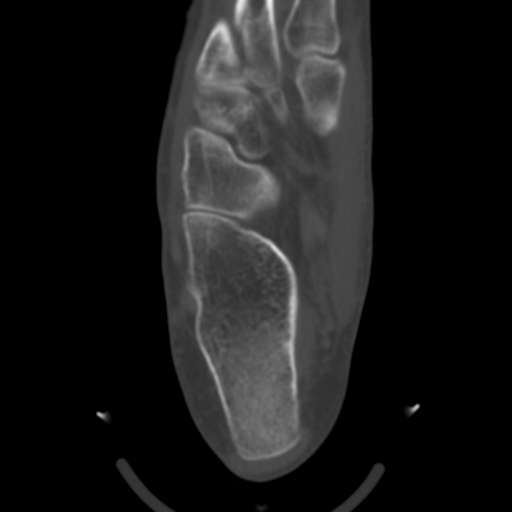
[im 35/91  soft-tissue]
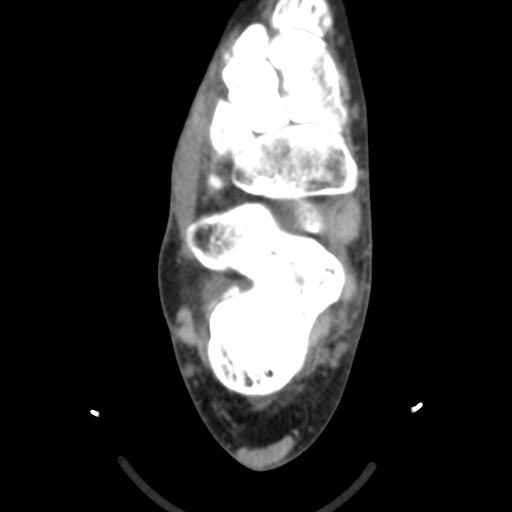
[im 35/91  bone]
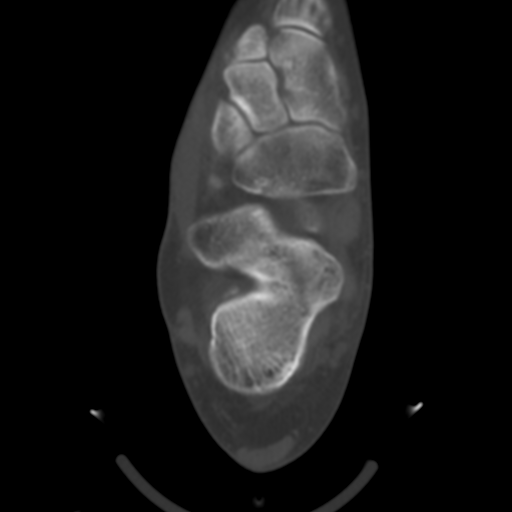
[im 42/91  bone]
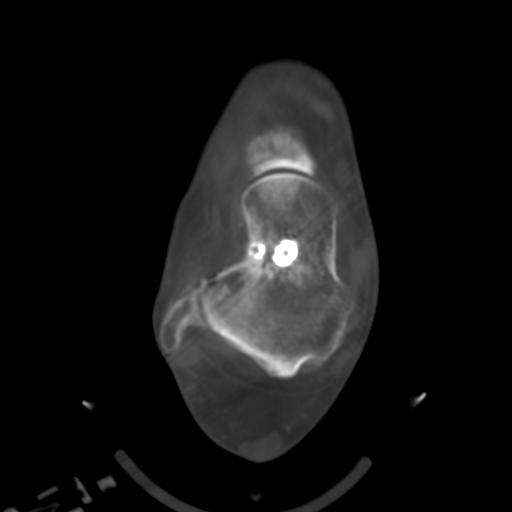
[im 49/91  bone]
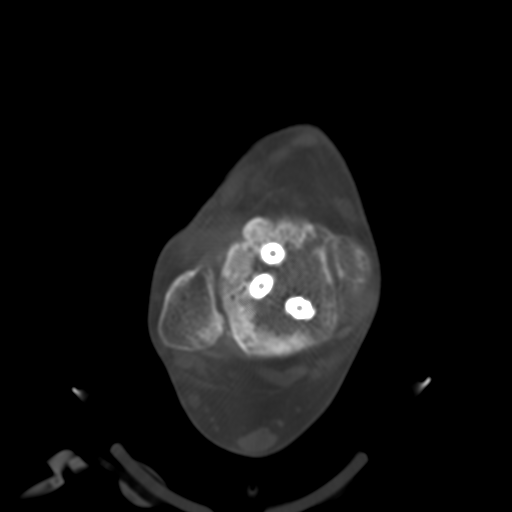
[im 56/91  bone]
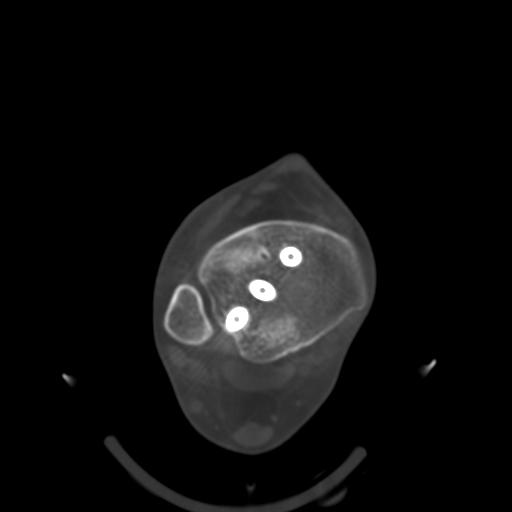
[im 63/91  soft-tissue]
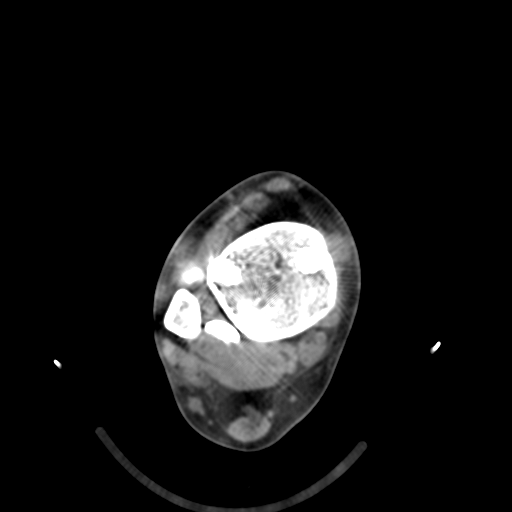
[im 63/91  bone]
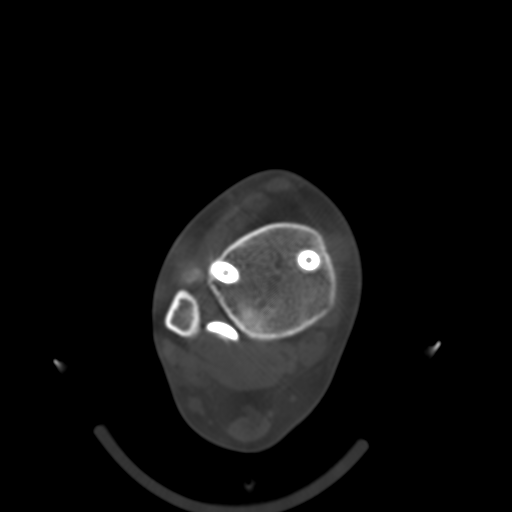
[im 70/91  bone]
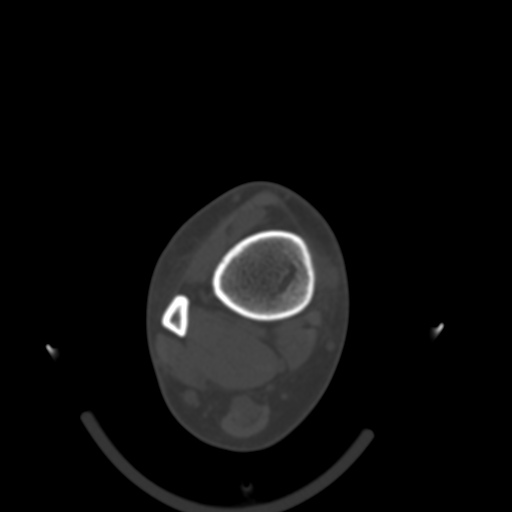
[im 77/91  bone]
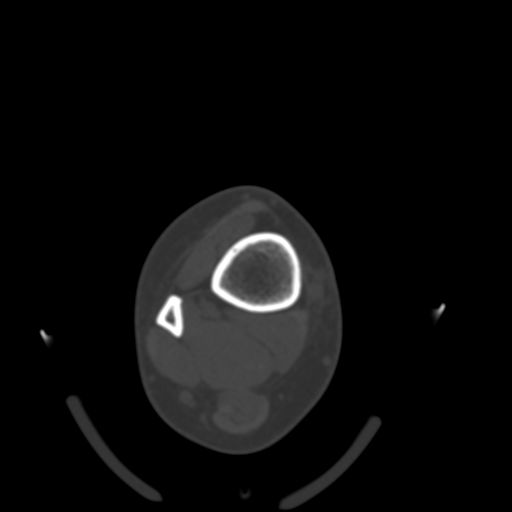
[im 84/91  bone]
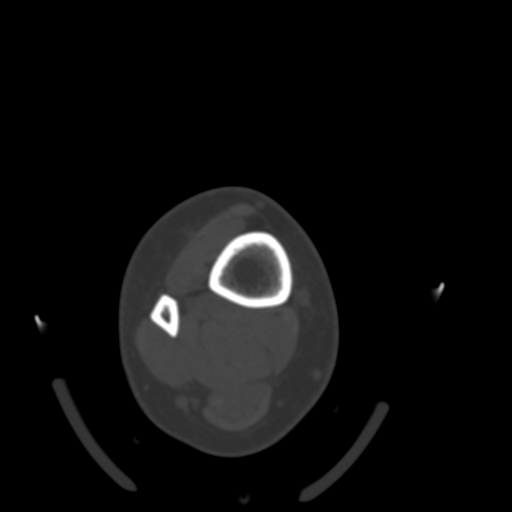

[12 of 14 positions shown; findings below may reference images not displayed]

FINDINGS: Bones/Joint/Cartilage

Arthrodesis of the tibiotalar joint with near complete osseous
fusion is noted with 3 fixation screws traversing the tibiotalar
joint are identified. The head of the posterolateral screw abuts the
posterior cortex of the distal fibula and has slightly remodeled the
posterior aspect of the fibula. No apparent hardware failure or
loosening. Osteoarthritic joint space narrowing is noted with
spurring about the ankle joint bilaterally. The subtalar joint is
maintained. No new fracture bone destruction.

Ligaments

Suboptimally assessed by CT.

Muscles and Tendons

Negative

Soft tissues

Negative
IMPRESSION: 1. Ankle arthrodesis with near complete osseous union. Resultant
osteoarthritic change along the medial and lateral aspect of the
talus. Intact midfoot and subtalar joints.
2. No evidence of hardware failure or loosening.
3. Acute fracture, joint effusion or bone destruction.

## 2019-02-09 ENCOUNTER — Other Ambulatory Visit: Payer: Self-pay | Admitting: Adult Health

## 2019-06-23 ENCOUNTER — Other Ambulatory Visit: Payer: Self-pay | Admitting: Adult Health

## 2019-08-21 NOTE — L&D Delivery Note (Addendum)
[facility] Delivery Note  Wendy Allen is a 29 y.o. now G1P1001 s/p SVD at [redacted]w[redacted]d who was admitted for PROM.    ROM: 10h 48m with clear fluid GBS Status: Negative Maximum Maternal Temperature: 98.4  Delivery Note At 11:06 AM a viable female was delivered via Vaginal, Spontaneous (Presentation: Right Occiput Anterior).  APGAR: 7, 9; weight 6 lbs 14.4 oz (3130 gm) .   Placenta status: Spontaneous, Intact.  Cord: 3 vessels with the following complications: None.  Cord pH: n/a  Anesthesia: Epidural Episiotomy: None Lacerations: 2nd degree - bilateral sulcus, vaginal and perineal Suture Repair: 2.0 vicryl rapide Est. Blood Loss (mL): 400  Postpartum Planning [x]  Mom to postpartum.  Baby to Couplet care / Skin to Skin. [x]  plans to breast & bottle feed [x]  message to sent to schedule follow-up  [x]  vaccines UTD  , MSN, CNM 05/03/20, 12:24 PM

## 2019-09-21 ENCOUNTER — Other Ambulatory Visit: Payer: Self-pay

## 2019-09-21 ENCOUNTER — Ambulatory Visit (INDEPENDENT_AMBULATORY_CARE_PROVIDER_SITE_OTHER): Payer: BC Managed Care – PPO

## 2019-09-21 VITALS — BP 116/79 | Ht 68.0 in | Wt 179.0 lb

## 2019-09-21 DIAGNOSIS — Z3201 Encounter for pregnancy test, result positive: Secondary | ICD-10-CM

## 2019-09-21 LAB — POCT URINE PREGNANCY: Preg Test, Ur: POSITIVE — AB

## 2019-09-21 MED ORDER — PNV PRENATAL PLUS MULTIVITAMIN 27-1 MG PO TABS: 1.0000 | ORAL_TABLET | Freq: Every day | ORAL | 12 refills | Status: DC

## 2019-09-21 NOTE — Progress Notes (Signed)
Chart reviewed for nurse visit. Agree with plan of care. Will rx prenatal plus Adline Potter, NP 09/21/2019 11:18 AM

## 2019-09-21 NOTE — Progress Notes (Addendum)
   NURSE VISIT- PREGNANCY CONFIRMATION   SUBJECTIVE:  Wendy Allen is a 29 y.o. G0P0000 female for pregnancy confirmation.   LMP 08-14-19.+ home pregnancy test OBJECTIVE:    Appears no apparent distress OB History  Gravida Para Term Preterm AB Living  0 0 0 0 0 0  SAB TAB Ectopic Multiple Live Births  0 0 0 0      Results for orders placed or performed in visit on 09/21/19 (from the past 24 hour(s))  POCT urine pregnancy   Collection Time: 09/21/19 10:44 AM  Result Value Ref Range   Preg Test, Ur Positive (A) Negative    ASSESSMENT:     PLAN: Schedule for dating ultrasound in 3 weeks** Prenatal vitamins needed   Nausea medicines not needed :Will route note to jennifer griffins     OB packet given  Derl Barrow Lendy Dittrich  09/21/2019 10:46 AM

## 2019-09-21 NOTE — Addendum Note (Signed)
Addended by: Cyril Mourning A on: 09/21/2019 11:19 AM   Modules accepted: Orders

## 2019-10-07 ENCOUNTER — Other Ambulatory Visit: Payer: Self-pay | Admitting: Obstetrics and Gynecology

## 2019-10-07 DIAGNOSIS — O3680X0 Pregnancy with inconclusive fetal viability, not applicable or unspecified: Secondary | ICD-10-CM

## 2019-10-09 ENCOUNTER — Telehealth: Payer: Self-pay | Admitting: Obstetrics & Gynecology

## 2019-10-09 NOTE — Telephone Encounter (Signed)

## 2019-10-12 ENCOUNTER — Other Ambulatory Visit: Payer: Self-pay

## 2019-10-12 ENCOUNTER — Ambulatory Visit (INDEPENDENT_AMBULATORY_CARE_PROVIDER_SITE_OTHER): Payer: BC Managed Care – PPO

## 2019-10-12 ENCOUNTER — Other Ambulatory Visit: Payer: Self-pay | Admitting: Obstetrics and Gynecology

## 2019-10-12 DIAGNOSIS — O3680X0 Pregnancy with inconclusive fetal viability, not applicable or unspecified: Secondary | ICD-10-CM

## 2019-10-12 DIAGNOSIS — Z3A08 8 weeks gestation of pregnancy: Secondary | ICD-10-CM | POA: Diagnosis not present

## 2019-10-12 NOTE — Progress Notes (Signed)
[place] DI/DI twin GS,normal ovaries  BABY A:CRL 14.8 mm=7+5 wks,positive fht 150 bpm,GS 28.9 mm BABY B: GS 11.4 mm=5+6 wks,absent fetal pole

## 2019-10-28 ENCOUNTER — Telehealth: Payer: Self-pay | Admitting: *Deleted

## 2019-10-28 MED ORDER — PROMETHAZINE HCL 25 MG PO TABS: 25.0000 mg | ORAL_TABLET | Freq: Four times a day (QID) | ORAL | 1 refills | Status: DC | PRN

## 2019-10-28 NOTE — Telephone Encounter (Signed)
Will rx phenergan  

## 2019-10-28 NOTE — Telephone Encounter (Signed)
Pt is pregnant and is requesting something for nausea. Please call. Thanks!! JSY

## 2019-11-02 ENCOUNTER — Telehealth: Payer: Self-pay | Admitting: *Deleted

## 2019-11-02 NOTE — Telephone Encounter (Signed)
Pt requesting rx for zofran

## 2019-11-03 NOTE — Telephone Encounter (Signed)
Patient informed zofran is not generally given in the first trimester as it can cause heart defects in the fetus.  Pt states she took the Phenergan prescribed but was drowsy for two days.  Informed patient she could break tablet in half and then the half into a quarter to see if that relieved her symptoms without making her drowsy.  Also given instructions on taking B6 and Unisom if she desired.  Pt verbalized understanding and stated she would try these.

## 2019-11-12 ENCOUNTER — Telehealth: Payer: Self-pay | Admitting: Women's Health

## 2019-11-12 NOTE — Telephone Encounter (Signed)

## 2019-11-13 ENCOUNTER — Other Ambulatory Visit: Payer: Self-pay | Admitting: Obstetrics and Gynecology

## 2019-11-13 DIAGNOSIS — Z3682 Encounter for antenatal screening for nuchal translucency: Secondary | ICD-10-CM

## 2019-11-16 ENCOUNTER — Ambulatory Visit (INDEPENDENT_AMBULATORY_CARE_PROVIDER_SITE_OTHER): Payer: BC Managed Care – PPO

## 2019-11-16 ENCOUNTER — Other Ambulatory Visit: Payer: Self-pay

## 2019-11-16 ENCOUNTER — Ambulatory Visit (INDEPENDENT_AMBULATORY_CARE_PROVIDER_SITE_OTHER): Payer: BC Managed Care – PPO | Admitting: Women's Health

## 2019-11-16 ENCOUNTER — Ambulatory Visit: Payer: BC Managed Care – PPO | Admitting: *Deleted

## 2019-11-16 ENCOUNTER — Encounter: Payer: Self-pay | Admitting: Women's Health

## 2019-11-16 VITALS — Wt 176.0 lb

## 2019-11-16 DIAGNOSIS — Z3682 Encounter for antenatal screening for nuchal translucency: Secondary | ICD-10-CM

## 2019-11-16 DIAGNOSIS — Z3400 Encounter for supervision of normal first pregnancy, unspecified trimester: Secondary | ICD-10-CM | POA: Insufficient documentation

## 2019-11-16 DIAGNOSIS — Z3401 Encounter for supervision of normal first pregnancy, first trimester: Secondary | ICD-10-CM

## 2019-11-16 DIAGNOSIS — Z79891 Long term (current) use of opiate analgesic: Secondary | ICD-10-CM | POA: Insufficient documentation

## 2019-11-16 DIAGNOSIS — Z3A13 13 weeks gestation of pregnancy: Secondary | ICD-10-CM

## 2019-11-16 DIAGNOSIS — O30009 Twin pregnancy, unspecified number of placenta and unspecified number of amniotic sacs, unspecified trimester: Secondary | ICD-10-CM | POA: Insufficient documentation

## 2019-11-16 DIAGNOSIS — Z363 Encounter for antenatal screening for malformations: Secondary | ICD-10-CM

## 2019-11-16 LAB — POCT URINALYSIS DIPSTICK OB
Blood, UA: NEGATIVE
Glucose, UA: NEGATIVE
Ketones, UA: NEGATIVE
Leukocytes, UA: NEGATIVE
Nitrite, UA: NEGATIVE
POC,PROTEIN,UA: NEGATIVE

## 2019-11-16 NOTE — Patient Instructions (Addendum)
Wendy Allen, I greatly value your feedback.  If you receive a survey following your visit with [place] today, we appreciate you taking the time to fill it out.  Thanks, Knute Neu, CNM, [facility]  [place]!!! It is now [place] [facility] ([place], [place] [address]) Entrance located off of [address] parking   [facility]  . [facility] [employer] [facility], [place]   [address] [place], [place]  . [facility], Dr. Valetta Close  [address] Linna Hoff (beside [address]) [phone]  . [facility]  [address] [place], [phone]    Nausea & Vomiting  Have saltine crackers or pretzels by your bed and eat a few bites before you raise your head out of bed in the morning  Eat small frequent meals throughout the day instead of large meals  Drink plenty of fluids throughout the day to stay hydrated, just don't drink a lot of fluids with your meals.  This can make your stomach fill up faster making you feel sick  Do not brush your teeth right after you eat  Products with real ginger are good for nausea, like ginger ale and ginger hard candy Make sure it says made with real ginger!  Sucking on sour candy like lemon heads is also good for nausea  If your prenatal vitamins make you nauseated, take them at night so you will sleep through the nausea  Sea Bands  If you feel like you need medicine for the nausea & vomiting please let us know  If you are unable to keep any fluids or food down please let us know   Constipation  Drink plenty of fluid, preferably water, throughout the day  Eat foods high in fiber such as fruits, vegetables, and grains  Exercise, such as walking, is a good way to keep your bowels regular  Drink warm fluids, especially warm prune juice, or decaf coffee  Eat a 1/2 cup of real oatmeal (not instant), 1/2 cup  applesauce, and 1/2-1 cup warm prune juice every day  If needed, you may take Colace (docusate sodium) stool softener once or twice a day to help keep the stool soft.   If you still are having problems with constipation, you may take Miralax once daily as needed to help keep your bowels regular.   Home Blood Pressure Monitoring for Patients   Your provider has recommended that you check your blood pressure (BP) at least once a week at home. If you do not have a blood pressure cuff at home, one will be provided for you. Contact your provider if you have not received your monitor within 1 week.   Helpful Tips for Accurate Home Blood Pressure Checks  . Don't smoke, exercise, or drink caffeine 30 minutes before checking your BP . Use the restroom before checking your BP (a full bladder can raise your pressure) . Relax in a comfortable upright chair . Feet on the ground . Left arm resting comfortably on a flat surface at the level of your heart . Legs uncrossed . Back supported . Sit quietly and don't talk . Place the cuff on your bare arm . Adjust snuggly, so that only two fingertips can fit between your skin and the top of the cuff . Check 2 readings separated by at least one minute . Keep a log of your BP readings . For a visual, please  reference this diagram: http://ccnc.care/bpdiagram  Provider Name: [facility]     Phone: [phone]  Zone 1: ALL CLEAR  Continue to monitor your symptoms:  . BP reading is less than 140 (top number) or less than 90 (bottom number)  . No right upper stomach pain . No headaches or seeing spots . No feeling nauseated or throwing up . No swelling in face and hands  Zone 2: CAUTION Call your doctor's office for any of the following:  . BP reading is greater than 140 (top number) or greater than 90 (bottom number)  . Stomach pain under your ribs in the middle or right side . Headaches or seeing spots . Feeling nauseated or throwing  up . Swelling in face and hands  Zone 3: EMERGENCY  Seek immediate medical care if you have any of the following:  . BP reading is greater than160 (top number) or greater than 110 (bottom number) . Severe headaches not improving with Tylenol . Serious difficulty catching your breath . Any worsening symptoms from Zone 2    First Trimester of Pregnancy The first trimester of pregnancy is from week 1 until the end of week 12 (months 1 through 3). A week after a sperm fertilizes an egg, the egg will implant on the wall of the uterus. This embryo will begin to develop into a baby. Genes from you and your partner are forming the baby. The female genes determine whether the baby is a boy or a girl. At 6-8 weeks, the eyes and face are formed, and the heartbeat can be seen on ultrasound. At the end of 12 weeks, all the baby's organs are formed.  Now that you are pregnant, you will want to do everything you can to have a healthy baby. Two of the most important things are to get good prenatal care and to follow your health care provider's instructions. Prenatal care is all the medical care you receive before the baby's birth. This care will help prevent, find, and treat any problems during the pregnancy and childbirth. BODY CHANGES Your body goes through many changes during pregnancy. The changes vary from woman to woman.   You may gain or lose a couple of pounds at first.  You may feel sick to your stomach (nauseous) and throw up (vomit). If the vomiting is uncontrollable, call your health care provider.  You may tire easily.  You may develop headaches that can be relieved by medicines approved by your health care provider.  You may urinate more often. Painful urination may mean you have a bladder infection.  You may develop heartburn as a result of your pregnancy.  You may develop constipation because certain hormones are causing the muscles that push waste through your intestines to slow  down.  You may develop hemorrhoids or swollen, bulging veins (varicose veins).  Your breasts may begin to grow larger and become tender. Your nipples may stick out more, and the tissue that surrounds them (areola) may become darker.  Your gums may bleed and may be sensitive to brushing and flossing.  Dark spots or blotches (chloasma, mask of pregnancy) may develop on your face. This will likely fade after the baby is born.  Your menstrual periods will stop.  You may have a loss of appetite.  You may develop cravings for certain kinds of food.  You may have changes in your emotions from day to day, such as being excited to be pregnant or being concerned that something may go  wrong with the pregnancy and baby.  You may have more vivid and strange dreams.  You may have changes in your hair. These can include thickening of your hair, rapid growth, and changes in texture. Some women also have hair loss during or after pregnancy, or hair that feels dry or thin. Your hair will most likely return to normal after your baby is born. WHAT TO EXPECT AT YOUR PRENATAL VISITS During a routine prenatal visit:  You will be weighed to make sure you and the baby are growing normally.  Your blood pressure will be taken.  Your abdomen will be measured to track your baby's growth.  The fetal heartbeat will be listened to starting around week 10 or 12 of your pregnancy.  Test results from any previous visits will be discussed. Your health care provider may ask you:  How you are feeling.  If you are feeling the baby move.  If you have had any abnormal symptoms, such as leaking fluid, bleeding, severe headaches, or abdominal cramping.  If you have any questions. Other tests that may be performed during your first trimester include:  Blood tests to find your blood type and to check for the presence of any previous infections. They will also be used to check for low iron levels (anemia) and Rh  antibodies. Later in the pregnancy, blood tests for diabetes will be done along with other tests if problems develop.  Urine tests to check for infections, diabetes, or protein in the urine.  An ultrasound to confirm the proper growth and development of the baby.  An amniocentesis to check for possible genetic problems.  Fetal screens for spina bifida and Down syndrome.  You may need other tests to make sure you and the baby are doing well. HOME CARE INSTRUCTIONS  Medicines  Follow your health care provider's instructions regarding medicine use. Specific medicines may be either safe or unsafe to take during pregnancy.  Take your prenatal vitamins as directed.  If you develop constipation, try taking a stool softener if your health care provider approves. Diet  Eat regular, well-balanced meals. Choose a variety of foods, such as meat or vegetable-based protein, fish, milk and low-fat dairy products, vegetables, fruits, and whole grain breads and cereals. Your health care provider will help you determine the amount of weight gain that is right for you.  Avoid raw meat and uncooked cheese. These carry germs that can cause birth defects in the baby.  Eating four or five small meals rather than three large meals a day may help relieve nausea and vomiting. If you start to feel nauseous, eating a few soda crackers can be helpful. Drinking liquids between meals instead of during meals also seems to help nausea and vomiting.  If you develop constipation, eat more high-fiber foods, such as fresh vegetables or fruit and whole grains. Drink enough fluids to keep your urine clear or pale yellow. Activity and Exercise  Exercise only as directed by your health care provider. Exercising will help you:  Control your weight.  Stay in shape.  Be prepared for labor and delivery.  Experiencing pain or cramping in the lower abdomen or low back is a good sign that you should stop exercising. Check  with your health care provider before continuing normal exercises.  Try to avoid standing for long periods of time. Move your legs often if you must stand in one place for a long time.  Avoid heavy lifting.  Wear low-heeled shoes, and practice good  posture.  You may continue to have sex unless your health care provider directs you otherwise. Relief of Pain or Discomfort  Wear a good support bra for breast tenderness.    Take warm sitz baths to soothe any pain or discomfort caused by hemorrhoids. Use hemorrhoid cream if your health care provider approves.    Rest with your legs elevated if you have leg cramps or low back pain.  If you develop varicose veins in your legs, wear support hose. Elevate your feet for 15 minutes, 3-4 times a day. Limit salt in your diet. Prenatal Care  Schedule your prenatal visits by the twelfth week of pregnancy. They are usually scheduled monthly at first, then more often in the last 2 months before delivery.  Write down your questions. Take them to your prenatal visits.  Keep all your prenatal visits as directed by your health care provider. Safety  Wear your seat belt at all times when driving.  Make a list of emergency phone numbers, including numbers for family, friends, the hospital, and police and fire departments. General Tips  Ask your health care provider for a referral to a local prenatal education class. Begin classes no later than at the beginning of month 6 of your pregnancy.  Ask for help if you have counseling or nutritional needs during pregnancy. Your health care provider can offer advice or refer you to specialists for help with various needs.  Do not use hot tubs, steam rooms, or saunas.  Do not douche or use tampons or scented sanitary pads.  Do not cross your legs for long periods of time.  Avoid cat litter boxes and soil used by cats. These carry germs that can cause birth defects in the baby and possibly loss of the fetus  by miscarriage or stillbirth.  Avoid all smoking, herbs, alcohol, and medicines not prescribed by your health care provider. Chemicals in these affect the formation and growth of the baby.  Schedule a dentist appointment. At home, brush your teeth with a soft toothbrush and be gentle when you floss. SEEK MEDICAL CARE IF:   You have dizziness.  You have mild pelvic cramps, pelvic pressure, or nagging pain in the abdominal area.  You have persistent nausea, vomiting, or diarrhea.  You have a bad smelling vaginal discharge.  You have pain with urination.  You notice increased swelling in your face, hands, legs, or ankles. SEEK IMMEDIATE MEDICAL CARE IF:   You have a fever.  You are leaking fluid from your vagina.  You have spotting or bleeding from your vagina.  You have severe abdominal cramping or pain.  You have rapid weight gain or loss.  You vomit blood or material that looks like coffee grounds.  You are exposed to [place] measles and have never had them.  You are exposed to fifth disease or chickenpox.  You develop a severe headache.  You have shortness of breath.  You have any kind of trauma, such as from a fall or a car accident. Document Released: 07/31/2001 Document Revised: 12/21/2013 Document Reviewed: 06/16/2013 [facility] Patient Information 2015 [place], [place]. This information is not intended to replace advice given to you by your health care provider. Make sure you discuss any questions you have with your health care provider.  Coronavirus (COVID-19) Are you at risk?  Are you at risk for the Coronavirus (COVID-19)?  To be considered HIGH RISK for Coronavirus (COVID-19), you have to meet the following criteria:  . Traveled to [place], [place], [place], [place] or [place]; or in the [place] to [place], [place], [place], or [place]; and have fever, cough, and shortness of breath within the last 2 weeks of travel OR . Been in close contact with  a person diagnosed with COVID-19 within the last 2 weeks and have fever, cough, and shortness of breath . IF YOU DO NOT MEET THESE CRITERIA, YOU ARE CONSIDERED LOW RISK FOR COVID-19.  What to do if you are HIGH RISK for COVID-19?  Marland Kitchen. If you are having a medical emergency, call 911. . Seek medical care right away. Before you go to a doctor's office, urgent care or emergency department, call ahead and tell them about your recent travel, contact with someone diagnosed with COVID-19, and your symptoms. You should receive instructions from your physician's office regarding next steps of care.  . When you arrive at healthcare provider, tell the healthcare staff immediately you have returned from visiting [place], [place], [place], [place] or [place]; or traveled in the [place] to [place], [place], [place], or [place]; in the last two weeks or you have been in close contact with a person diagnosed with COVID-19 in the last 2 weeks.   . Tell the health care staff about your symptoms: fever, cough and shortness of breath. . After you have been seen by a medical provider, you will be either: o Tested for (COVID-19) and discharged home on quarantine except to seek medical care if symptoms worsen, and asked to  - Stay home and avoid contact with others until you get your results (4-5 days)  - Avoid travel on public transportation if possible (such as bus, train, or airplane) or o Sent to the Emergency Department by EMS for evaluation, COVID-19 testing, and possible admission depending on your condition and test results.  What to do if you are LOW RISK for COVID-19?  Reduce your risk of any infection by using the same precautions used for avoiding the common cold or flu:  Marland Kitchen. Wash your hands often with soap and warm water for at least 20 seconds.  If soap and water are not readily available, use an alcohol-based hand sanitizer with at least 60% alcohol.  . If coughing or sneezing, cover your mouth and  nose by coughing or sneezing into the elbow areas of your shirt or coat, into a tissue or into your sleeve (not your hands). . Avoid shaking hands with others and consider head nods or verbal greetings only. . Avoid touching your eyes, nose, or mouth with unwashed hands.  . Avoid close contact with people who are sick. . Avoid places or events with large numbers of people in one location, like concerts or sporting events. . Carefully consider travel plans you have or are making. . If you are planning any travel outside or inside the [place], visit the CDC's Travelers' Health webpage for the latest health notices. . If you have some symptoms but not all symptoms, continue to monitor at home and seek medical attention if your symptoms worsen. . If you are having a medical emergency, call 911.   ADDITIONAL HEALTHCARE OPTIONS FOR PATIENTS  [facility] / e-Visit: [email or web address]         [facility]: [phone]  [facility]: [phone]                   [facility]: [phone]

## 2019-11-16 NOTE — Progress Notes (Signed)
INITIAL OBSTETRICAL VISIT Patient name: Wendy Allen MRN [id number]  Date of birth: Nov 20, 1990 Chief Complaint:   Initial Prenatal Visit  History of Present Illness:   Wendy Allen is a 29 y.o. G98P0000 Caucasian female at [redacted]w[redacted]d by LMP c/w 7wk u/s, with an Estimated Date of Delivery: 05/20/20 being seen today for her initial obstetrical visit.   Her obstetrical history is significant for primigravida.  Quit smoking 4 days ago! Initially di-di twin pregnancy, twin B w/ empty sac @ 7wks.  Dep- no meds, doing well Size G bra, chronic back pain, interested in reduction after baby Chronic Rt foot pain s/p fusion, on tramadol 50mg  daily-TID x 60yr from [facility], gets filled at [facility]. Trying to take only when absolutely needed now.  Today she reports no other complaints.  Patient's last menstrual period was 08/14/2019. Last pap 10/07/17. Results were: normal Review of Systems:   Pertinent items are noted in HPI Denies cramping/contractions, leakage of fluid, vaginal bleeding, abnormal vaginal discharge w/ itching/odor/irritation, headaches, visual changes, shortness of breath, chest pain, abdominal pain, severe nausea/vomiting, or problems with urination or bowel movements unless otherwise stated above.  Pertinent History Reviewed:  Reviewed past medical,surgical, social, obstetrical and family history.  Reviewed problem list, medications and allergies. OB History  Gravida Para Term Preterm AB Living  1 0 0 0 0 0  SAB TAB Ectopic Multiple Live Births  0 0 0 0      # Outcome Date GA Lbr Len/2nd Weight Sex Delivery Anes PTL Lv  1 Current            Physical Assessment:   Vitals:   11/16/19 0959  Weight: 176 lb (79.8 kg)  Body mass index is 26.76 kg/m.       Physical Examination:  General appearance - well appearing, and in no distress  Mental status - alert, oriented to person, place, and time  Psych:  She has a normal mood and affect  Skin - warm and dry, normal  color, no suspicious lesions noted  Chest - effort normal, all lung fields clear to auscultation bilaterally  Heart - normal rate and regular rhythm  Abdomen - soft, nontender  Extremities:  No swelling or varicosities noted  Thin prep pap is not done   TODAY'S NT 11/18/19 13+3 wks,measurements c/w dates,normal ovaries,crl 69.76 mm,fhr 160 bpm,NB present,fundal placenta,unable to obtain NT because of retroverted uterus,have pt come back today for additional images   2nd attempt: unable to obtain NT because of retroverted uterus and fetal position.  Results for orders placed or performed in visit on 11/16/19 (from the past 24 hour(s))  POC Urinalysis Dipstick OB   Collection Time: 11/16/19 10:17 AM  Result Value Ref Range   Color, UA     Clarity, UA     Glucose, UA Negative Negative   Bilirubin, UA     Ketones, UA neg    Spec Grav, UA     Blood, UA neg    pH, UA     POC,PROTEIN,UA Negative Negative, Trace, Small (1+), Moderate (2+), Large (3+), 4+   Urobilinogen, UA     Nitrite, UA neg    Leukocytes, UA Negative Negative   Appearance     Odor      Assessment & Plan:  1) Low-Risk Pregnancy G1P0000 at [redacted]w[redacted]d with an Estimated Date of Delivery: 05/20/20   2) Initial OB visit  3) Chronic opiate use> tramadol 50mg  1-3/day x 72yr, not in , states  she's getting from PCP and gets [facility], called pharmacy and left message for them to return my call to confirm. Discussed and gave printed info for local subutex clinics  4) Initial di-di twin pregnancy> w/ twin B empty sac @ 7wks  5) Dep> no meds, doing well  6) Chronic back pain from large breasts>size G, interested in reduction postpartum Meds: No orders of the defined types were placed in this encounter.   Initial labs obtained Continue prenatal vitamins Reviewed n/v relief measures and warning s/s to report Reviewed recommended weight gain based on pre-gravid BMI Encouraged well-balanced diet Genetic  Screening discussed: requested nt/it, maternit21, unable to get nt today after 2 attempts, will do AFP next visit Cystic fibrosis, SMA, Fragile X screening discussed requested Ultrasound discussed; fetal survey: requested CCNC completed>not applying for preg mcaid  The nature of [place] for [employer] with multiple MDs and other Advanced Practice Providers was explained to patient; also emphasized that fellows, residents, and students are part of our team. Does not have home bp cuff.   Follow-up: Return in about 1 month (around 12/18/2019) for LROB, 2nd IT, QI:WLNLGXQ, in person, CNM.   Orders Placed This Encounter  Procedures  . GC/Chlamydia Probe Amp  . Urine Culture  . US OB Comp + 14 Wk  . MaterniT 21 plus Core, Blood  . Inheritest Core(CF97,SMA,FraX)  . Hgb Fractionation Cascade  . Hepatitis C antibody  . Obstetric Panel, Including HIV  . Pain Management Screening Profile (10S)  . POC Urinalysis Dipstick OB    Roma Schanz CNM, WHNP-BC 11/16/2019 1:20 PM

## 2019-11-16 NOTE — Progress Notes (Addendum)
[place] TA and TV:13+3 wks,measurements c/w dates,normal ovaries,crl 69.76 mm,fhr 160 bpm,NB present,fundal placenta,unable to obtain NT because of retroverted uterus and fetal position.

## 2019-11-18 ENCOUNTER — Encounter: Payer: Self-pay | Admitting: Women's Health

## 2019-11-18 DIAGNOSIS — O26899 Other specified pregnancy related conditions, unspecified trimester: Secondary | ICD-10-CM | POA: Insufficient documentation

## 2019-11-18 DIAGNOSIS — Z6791 Unspecified blood type, Rh negative: Secondary | ICD-10-CM | POA: Insufficient documentation

## 2019-11-21 ENCOUNTER — Encounter: Payer: Self-pay | Admitting: Women's Health

## 2019-11-21 DIAGNOSIS — O99891 Other specified diseases and conditions complicating pregnancy: Secondary | ICD-10-CM | POA: Insufficient documentation

## 2019-11-21 DIAGNOSIS — R8271 Bacteriuria: Secondary | ICD-10-CM | POA: Insufficient documentation

## 2019-11-23 ENCOUNTER — Other Ambulatory Visit: Payer: Self-pay | Admitting: Women's Health

## 2019-11-23 DIAGNOSIS — R8271 Bacteriuria: Secondary | ICD-10-CM

## 2019-11-23 LAB — URINE CULTURE

## 2019-11-23 LAB — PMP SCREEN PROFILE (10S), URINE
Amphetamine Scrn, Ur: NEGATIVE ng/mL
BARBITURATE SCREEN URINE: NEGATIVE ng/mL
BENZODIAZEPINE SCREEN, URINE: NEGATIVE ng/mL
CANNABINOIDS UR QL SCN: NEGATIVE ng/mL
Cocaine (Metab) Scrn, Ur: NEGATIVE ng/mL
Creatinine(Crt), U: 107.8 mg/dL (ref 20.0–300.0)
Methadone Screen, Urine: NEGATIVE ng/mL
OXYCODONE+OXYMORPHONE UR QL SCN: NEGATIVE ng/mL
Opiate Scrn, Ur: NEGATIVE ng/mL
Ph of Urine: 7.6 (ref 4.5–8.9)
Phencyclidine Qn, Ur: NEGATIVE ng/mL
Propoxyphene Scrn, Ur: NEGATIVE ng/mL

## 2019-11-23 LAB — GC/CHLAMYDIA PROBE AMP
Chlamydia trachomatis, NAA: NEGATIVE
Neisseria Gonorrhoeae by PCR: NEGATIVE

## 2019-11-23 LAB — SPECIMEN STATUS REPORT

## 2019-11-23 MED ORDER — NITROFURANTOIN MONOHYD MACRO 100 MG PO CAPS: 100.0000 mg | ORAL_CAPSULE | Freq: Two times a day (BID) | ORAL | 0 refills | Status: DC

## 2019-11-24 ENCOUNTER — Telehealth: Payer: Self-pay | Admitting: *Deleted

## 2019-11-24 NOTE — Telephone Encounter (Signed)
Pt aware she has a UTI and to take antibiotic as directed. Pt voiced understanding. JSY

## 2019-11-24 NOTE — Telephone Encounter (Signed)
-----   Message from Cheral Marker, [place] sent at 11/24/2019  2:10 PM EDT ----- Hasn't read mychart msg. Please let her know urine culture shows UTI, I have sent antibiotic to pharmacy, take all as directed. Thanks!

## 2019-11-26 LAB — OBSTETRIC PANEL, INCLUDING HIV
Antibody Screen: NEGATIVE
Basophils Absolute: 0.1 10*3/uL (ref 0.0–0.2)
Basos: 1 %
EOS (ABSOLUTE): 0.1 10*3/uL (ref 0.0–0.4)
Eos: 1 %
HIV Screen 4th Generation wRfx: NONREACTIVE
Hematocrit: 38.4 % (ref 34.0–46.6)
Hemoglobin: 12.9 g/dL (ref 11.1–15.9)
Hepatitis B Surface Ag: NEGATIVE
Immature Grans (Abs): 0.1 10*3/uL (ref 0.0–0.1)
Immature Granulocytes: 1 %
Lymphocytes Absolute: 2.5 10*3/uL (ref 0.7–3.1)
Lymphs: 22 %
MCH: 29.5 pg (ref 26.6–33.0)
MCHC: 33.6 g/dL (ref 31.5–35.7)
MCV: 88 fL (ref 79–97)
Monocytes Absolute: 0.6 10*3/uL (ref 0.1–0.9)
Monocytes: 6 %
Neutrophils Absolute: 7.7 10*3/uL — ABNORMAL HIGH (ref 1.4–7.0)
Neutrophils: 69 %
Platelets: 236 10*3/uL (ref 150–450)
RBC: 4.37 x10E6/uL (ref 3.77–5.28)
RDW: 12.4 % (ref 11.7–15.4)
RPR Ser Ql: NONREACTIVE
Rh Factor: NEGATIVE
Rubella Antibodies, IGG: 3.51 index (ref >0.99)
WBC: 11 10*3/uL — ABNORMAL HIGH (ref 3.4–10.8)

## 2019-11-26 LAB — MATERNIT 21 PLUS CORE, BLOOD
Fetal Fraction: 6
Result (T21): NEGATIVE
Trisomy 13 (Patau syndrome): NEGATIVE
Trisomy 18 (Edwards syndrome): NEGATIVE
Trisomy 21 (Down syndrome): NEGATIVE

## 2019-11-26 LAB — INHERITEST CORE(CF97,SMA,FRAX)

## 2019-11-26 LAB — HGB FRACTIONATION CASCADE
Hgb A2: 2.7 % (ref 1.8–3.2)
Hgb A: 97.3 % (ref 96.4–98.8)
Hgb F: 0 % (ref 0.0–2.0)
Hgb S: 0 %

## 2019-11-26 LAB — HEPATITIS C ANTIBODY: Hep C Virus Ab: <0.1 s/co ratio (ref 0.0–0.9)

## 2019-12-14 ENCOUNTER — Ambulatory Visit (INDEPENDENT_AMBULATORY_CARE_PROVIDER_SITE_OTHER): Payer: BC Managed Care – PPO | Admitting: Women's Health

## 2019-12-14 ENCOUNTER — Encounter: Payer: Self-pay | Admitting: Women's Health

## 2019-12-14 ENCOUNTER — Other Ambulatory Visit: Payer: Self-pay

## 2019-12-14 ENCOUNTER — Ambulatory Visit: Payer: BC Managed Care – PPO

## 2019-12-14 VITALS — BP 91/61 | HR 82 | Wt 177.0 lb

## 2019-12-14 DIAGNOSIS — Z3402 Encounter for supervision of normal first pregnancy, second trimester: Secondary | ICD-10-CM

## 2019-12-14 DIAGNOSIS — O2341 Unspecified infection of urinary tract in pregnancy, first trimester: Secondary | ICD-10-CM

## 2019-12-14 DIAGNOSIS — Z331 Pregnant state, incidental: Secondary | ICD-10-CM

## 2019-12-14 DIAGNOSIS — Z3A17 17 weeks gestation of pregnancy: Secondary | ICD-10-CM

## 2019-12-14 DIAGNOSIS — Z1389 Encounter for screening for other disorder: Secondary | ICD-10-CM

## 2019-12-14 DIAGNOSIS — R8271 Bacteriuria: Secondary | ICD-10-CM

## 2019-12-14 DIAGNOSIS — O99322 Drug use complicating pregnancy, second trimester: Secondary | ICD-10-CM

## 2019-12-14 DIAGNOSIS — Z79891 Long term (current) use of opiate analgesic: Secondary | ICD-10-CM

## 2019-12-14 DIAGNOSIS — Z1379 Encounter for other screening for genetic and chromosomal anomalies: Secondary | ICD-10-CM

## 2019-12-14 DIAGNOSIS — O99891 Other specified diseases and conditions complicating pregnancy: Secondary | ICD-10-CM

## 2019-12-14 LAB — POCT URINALYSIS DIPSTICK OB
Blood, UA: NEGATIVE
Glucose, UA: NEGATIVE
Ketones, UA: NEGATIVE
Leukocytes, UA: NEGATIVE
Nitrite, UA: NEGATIVE
POC,PROTEIN,UA: NEGATIVE

## 2019-12-14 NOTE — Progress Notes (Signed)
LOW-RISK PREGNANCY VISIT Patient name: Wendy Allen MRN [id number]  Date of birth: 12/22/1990 Chief Complaint:   Routine Prenatal Visit (AFP)  History of Present Illness:   Wendy Allen is a 29 y.o. G74P0000 female at [redacted]w[redacted]d with an Estimated Date of Delivery: 05/20/20 being seen today for ongoing management of a low-risk pregnancy.  Depression screen [facility] 2/9 11/16/2019 10/07/2017 06/12/2016  Decreased Interest 0 0 0  Down, Depressed, Hopeless 0 0 0  PHQ - 2 Score 0 0 0  Altered sleeping 2 - -  Tired, decreased energy 2 - -  Change in appetite 1 - -  Feeling bad or failure about yourself  0 - -  Trouble concentrating 0 - -  Moving slowly or fidgety/restless 0 - -  Suicidal thoughts 0 - -  PHQ-9 Score 5 - -  Difficult doing work/chores Not difficult at all - -    Today she reports constant lower abd pain/pulling, has to stock shelves at work. Weaned/quit tramadol on her own, last dose 12/11/19, doing fine w/o it, no s/s w/drawal, no desire for meds.  .  .  Movement: Absent. denies leaking of fluid. Review of Systems:   Pertinent items are noted in HPI Denies abnormal vaginal discharge w/ itching/odor/irritation, headaches, visual changes, shortness of breath, chest pain, abdominal pain, severe nausea/vomiting, or problems with urination or bowel movements unless otherwise stated above. Pertinent History Reviewed:  Reviewed past medical,surgical, social, obstetrical and family history.  Reviewed problem list, medications and allergies. Physical Assessment:   Vitals:   12/14/19 1115  BP: 91/61  Pulse: 82  Weight: 177 lb (80.3 kg)  Body mass index is 26.91 kg/m.        Physical Examination:   General appearance: Well appearing, and in no distress  Mental status: Alert, oriented to person, place, and time  Skin: Warm & dry  Cardiovascular: Normal heart rate noted  Respiratory: Normal respiratory effort, no distress  Abdomen: Soft, gravid, nontender  Pelvic: Cervical  exam deferred         Extremities: Edema: Trace  Fetal Status: Fetal Heart Rate (bpm): 147   Movement: Absent    Chaperone: n/a    Results for orders placed or performed in visit on 12/14/19 (from the past 24 hour(s))  POC Urinalysis Dipstick OB   Collection Time: 12/14/19 11:24 AM  Result Value Ref Range   Color, UA     Clarity, UA     Glucose, UA Negative Negative   Bilirubin, UA     Ketones, UA neg    Spec Grav, UA     Blood, UA neg    pH, UA     POC,PROTEIN,UA Negative Negative, Trace, Small (1+), Moderate (2+), Large (3+), 4+   Urobilinogen, UA     Nitrite, UA neg    Leukocytes, UA Negative Negative   Appearance     Odor      Assessment & Plan:  1) Low-risk pregnancy G1P0000 at [redacted]w[redacted]d with an Estimated Date of Delivery: 05/20/20   2) Chronic opiate use, weaned/quit tramadol on her own, last dose 4/23, doing well  3) Strained lower abd muscles> r/t her work, note to be out until Thursday  4) UTI first trimester> POC urine cx today   Meds: No orders of the defined types were placed in this encounter.  Labs/procedures today: AFP, urine cx  Plan:  Continue routine obstetrical care  Next visit: prefers in person    Reviewed: Preterm labor symptoms and  general obstetric precautions including but not limited to vaginal bleeding, contractions, leaking of fluid and fetal movement were reviewed in detail with the patient.  All questions were answered.   Follow-up: Return in about 1 week (around 12/21/2019) for XU:CJARWPT only (no visit), then 4wks for LROB w/ CNM, in person.  Orders Placed This Encounter  Procedures  . Urine Culture  . AFP TETRA  . POC Urinalysis Dipstick OB   Cheral Marker CNM, [facility] 12/14/2019 11:51 AM

## 2019-12-14 NOTE — Patient Instructions (Signed)
Wendy Allen, I greatly value your feedback.  If you receive a survey following your visit with [place] today, we appreciate you taking the time to fill it out.  Thanks, Joellyn Haff, CNM, WHNP-BC  [facility] ([address] [place], [place] [address]) Entrance C, located off of [address] valet parking  Go to [email or web address] to register for FREE online childbirth classes  [place] Pediatricians/Family Doctors:  [facility] [phone]            [facility] [phone]                 [facility] [phone] (usually not accepting new patients unless you have family there already, you are always welcome to call and ask)       [facility] Department [phone]       [facility] Pediatricians/Family Doctors:   [facility]: [phone]  [facility]: [phone]  [facility]: [phone]  [facility] Doctors:   [facility]: [phone]   [facility]: [phone]  [facility] Doctors:  [facility]: [phone]    Home Blood Pressure Monitoring for Patients   Your provider has recommended that you check your blood pressure (BP) at least once a week at home. If you do not have a blood pressure cuff at home, one will be provided for you. Contact your provider if you have not received your monitor within 1 week.   Helpful Tips for Accurate Home Blood Pressure Checks  . Don't smoke, exercise, or drink caffeine 30 minutes before checking your BP . Use the restroom before checking your BP (a full bladder can raise your pressure) . Relax in a comfortable upright chair . Feet on the ground . Left arm resting comfortably on a flat surface at the level of your heart . Legs uncrossed . Back supported . Sit quietly and don't talk . Place the cuff on your bare arm . Adjust snuggly,  so that only two fingertips can fit between your skin and the top of the cuff . Check 2 readings separated by at least one minute . Keep a log of your BP readings . For a visual, please reference this diagram: http://ccnc.care/bpdiagram  Provider Name: [facility]     Phone: [phone]  Zone 1: ALL CLEAR  Continue to monitor your symptoms:  . BP reading is less than 140 (top number) or less than 90 (bottom number)  . No right upper stomach pain . No headaches or seeing spots . No feeling nauseated or throwing up . No swelling in face and hands  Zone 2: CAUTION Call your doctor's office for any of the following:  . BP reading is greater than 140 (top number) or greater than 90 (bottom number)  . Stomach pain under your ribs in the middle or right side . Headaches or seeing spots . Feeling nauseated or throwing up . Swelling in face and hands  Zone 3: EMERGENCY  Seek immediate medical care if you have any of the following:  . BP reading is greater than160 (top number) or greater than 110 (bottom number) . Severe headaches not improving with Tylenol . Serious difficulty catching your breath . Any worsening symptoms from Zone 2     Second Trimester of Pregnancy The second trimester is from week 14 through week 27 (months 4 through 6). The second trimester is often a time when you feel your  best. Your body has adjusted to being pregnant, and you begin to feel better physically. Usually, morning sickness has lessened or quit completely, you may have more energy, and you may have an increase in appetite. The second trimester is also a time when the fetus is growing rapidly. At the end of the sixth month, the fetus is about 9 inches long and weighs about 1 pounds. You will likely begin to feel the baby move (quickening) between 16 and 20 weeks of pregnancy. Body changes during your second trimester Your body continues to go through many changes during your second trimester. The  changes vary from woman to woman.  Your weight will continue to increase. You will notice your lower abdomen bulging out.  You may begin to get stretch marks on your hips, abdomen, and breasts.  You may develop headaches that can be relieved by medicines. The medicines should be approved by your health care provider.  You may urinate more often because the fetus is pressing on your bladder.  You may develop or continue to have heartburn as a result of your pregnancy.  You may develop constipation because certain hormones are causing the muscles that push waste through your intestines to slow down.  You may develop hemorrhoids or swollen, bulging veins (varicose veins).  You may have back pain. This is caused by: ? Weight gain. ? Pregnancy hormones that are relaxing the joints in your pelvis. ? A shift in weight and the muscles that support your balance.  Your breasts will continue to grow and they will continue to become tender.  Your gums may bleed and may be sensitive to brushing and flossing.  Dark spots or blotches (chloasma, mask of pregnancy) may develop on your face. This will likely fade after the baby is born.  A dark line from your belly button to the pubic area (linea nigra) may appear. This will likely fade after the baby is born.  You may have changes in your hair. These can include thickening of your hair, rapid growth, and changes in texture. Some women also have hair loss during or after pregnancy, or hair that feels dry or thin. Your hair will most likely return to normal after your baby is born.  What to expect at prenatal visits During a routine prenatal visit:  You will be weighed to make sure you and the fetus are growing normally.  Your blood pressure will be taken.  Your abdomen will be measured to track your baby's growth.  The fetal heartbeat will be listened to.  Any test results from the previous visit will be discussed.  Your health care  provider may ask you:  How you are feeling.  If you are feeling the baby move.  If you have had any abnormal symptoms, such as leaking fluid, bleeding, severe headaches, or abdominal cramping.  If you are using any tobacco products, including cigarettes, chewing tobacco, and electronic cigarettes.  If you have any questions.  Other tests that may be performed during your second trimester include:  Blood tests that check for: ? Low iron levels (anemia). ? High blood sugar that affects pregnant women (gestational diabetes) between 71 and 28 weeks. ? Rh antibodies. This is to check for a protein on red blood cells (Rh factor).  Urine tests to check for infections, diabetes, or protein in the urine.  An ultrasound to confirm the proper growth and development of the baby.  An amniocentesis to check for possible genetic problems.  Fetal  screens for spina bifida and Down syndrome.  HIV (human immunodeficiency virus) testing. Routine prenatal testing includes screening for HIV, unless you choose not to have this test.  Follow these instructions at home: Medicines  Follow your health care provider's instructions regarding medicine use. Specific medicines may be either safe or unsafe to take during pregnancy.  Take a prenatal vitamin that contains at least 600 micrograms (mcg) of folic acid.  If you develop constipation, try taking a stool softener if your health care provider approves. Eating and drinking  Eat a balanced diet that includes fresh fruits and vegetables, whole grains, good sources of protein such as meat, eggs, or tofu, and low-fat dairy. Your health care provider will help you determine the amount of weight gain that is right for you.  Avoid raw meat and uncooked cheese. These carry germs that can cause birth defects in the baby.  If you have low calcium intake from food, talk to your health care provider about whether you should take a daily calcium  supplement.  Limit foods that are high in fat and processed sugars, such as fried and sweet foods.  To prevent constipation: ? Drink enough fluid to keep your urine clear or pale yellow. ? Eat foods that are high in fiber, such as fresh fruits and vegetables, whole grains, and beans. Activity  Exercise only as directed by your health care provider. Most women can continue their usual exercise routine during pregnancy. Try to exercise for 30 minutes at least 5 days a week. Stop exercising if you experience uterine contractions.  Avoid heavy lifting, wear low heel shoes, and practice good posture.  A sexual relationship may be continued unless your health care provider directs you otherwise. Relieving pain and discomfort  Wear a good support bra to prevent discomfort from breast tenderness.  Take warm sitz baths to soothe any pain or discomfort caused by hemorrhoids. Use hemorrhoid cream if your health care provider approves.  Rest with your legs elevated if you have leg cramps or low back pain.  If you develop varicose veins, wear support hose. Elevate your feet for 15 minutes, 3-4 times a day. Limit salt in your diet. Prenatal Care  Write down your questions. Take them to your prenatal visits.  Keep all your prenatal visits as told by your health care provider. This is important. Safety  Wear your seat belt at all times when driving.  Make a list of emergency phone numbers, including numbers for family, friends, the hospital, and police and fire departments. General instructions  Ask your health care provider for a referral to a local prenatal education class. Begin classes no later than the beginning of month 6 of your pregnancy.  Ask for help if you have counseling or nutritional needs during pregnancy. Your health care provider can offer advice or refer you to specialists for help with various needs.  Do not use hot tubs, steam rooms, or saunas.  Do not douche or use  tampons or scented sanitary pads.  Do not cross your legs for long periods of time.  Avoid cat litter boxes and soil used by cats. These carry germs that can cause birth defects in the baby and possibly loss of the fetus by miscarriage or stillbirth.  Avoid all smoking, herbs, alcohol, and unprescribed drugs. Chemicals in these products can affect the formation and growth of the baby.  Do not use any products that contain nicotine or tobacco, such as cigarettes and e-cigarettes. If you need help  quitting, ask your health care provider.  Visit your dentist if you have not gone yet during your pregnancy. Use a soft toothbrush to brush your teeth and be gentle when you floss. Contact a health care provider if:  You have dizziness.  You have mild pelvic cramps, pelvic pressure, or nagging pain in the abdominal area.  You have persistent nausea, vomiting, or diarrhea.  You have a bad smelling vaginal discharge.  You have pain when you urinate. Get help right away if:  You have a fever.  You are leaking fluid from your vagina.  You have spotting or bleeding from your vagina.  You have severe abdominal cramping or pain.  You have rapid weight gain or weight loss.  You have shortness of breath with chest pain.  You notice sudden or extreme swelling of your face, hands, ankles, feet, or legs.  You have not felt your baby move in over an hour.  You have severe headaches that do not go away when you take medicine.  You have vision changes. Summary  The second trimester is from week 14 through week 27 (months 4 through 6). It is also a time when the fetus is growing rapidly.  Your body goes through many changes during pregnancy. The changes vary from woman to woman.  Avoid all smoking, herbs, alcohol, and unprescribed drugs. These chemicals affect the formation and growth your baby.  Do not use any tobacco products, such as cigarettes, chewing tobacco, and e-cigarettes. If you  need help quitting, ask your health care provider.  Contact your health care provider if you have any questions. Keep all prenatal visits as told by your health care provider. This is important. This information is not intended to replace advice given to you by your health care provider. Make sure you discuss any questions you have with your health care provider. Document Released: 07/31/2001 Document Revised: 01/12/2016 Document Reviewed: 10/07/2012 Elsevier Interactive Patient Education  2017 [place] FLU! Because you are pregnant, we at [facility], along with the Centers for Disease Control (CDC), recommend that you receive the flu vaccine to protect yourself and your baby from the flu. The flu is more likely to cause severe illness in pregnant women than in women of reproductive age who are not pregnant. Changes in the immune system, heart, and lungs during pregnancy make pregnant women (and women up to two weeks postpartum) more prone to severe illness from flu, including illness resulting in hospitalization. Flu also may be harmful for a pregnant woman's developing baby. A common flu symptom is fever, which may be associated with neural tube defects and other adverse outcomes for a developing baby. Getting vaccinated can also help protect a baby after birth from flu. (Mom passes antibodies onto the developing baby during her pregnancy.)  A Flu Vaccine is the Best Protection Against Flu Getting a flu vaccine is the first and most important step in protecting against flu. Pregnant women should get a flu shot and not the live attenuated influenza vaccine (LAIV), also known as nasal spray flu vaccine. Flu vaccines given during pregnancy help protect both the mother and her baby from flu. Vaccination has been shown to reduce the risk of flu-associated acute respiratory infection in pregnant women by up to one-half. A 2018 study showed that getting a flu shot  reduced a pregnant woman's risk of being hospitalized with flu by an average of 40 percent. Pregnant women who get a  flu vaccine are also helping to protect their babies from flu illness for the first several months after their birth, when they are too young to get vaccinated.   A Long Record of Safety for Flu Shots in Pregnant Women Flu shots have been given to millions of pregnant women over many years with a good safety record. There is a lot of evidence that flu vaccines can be given safely during pregnancy; though these data are limited for the first trimester. The CDC recommends that pregnant women get vaccinated during any trimester of their pregnancy. It is very important for pregnant women to get the flu shot.   Other Preventive Actions In addition to getting a flu shot, pregnant women should take the same everyday preventive actions the CDC recommends of everyone, including covering coughs, washing hands often, and avoiding people who are sick.  Symptoms and Treatment If you get sick with flu symptoms call your doctor right away. There are antiviral drugs that can treat flu illness and prevent serious flu complications. The CDC recommends prompt treatment for people who have influenza infection or suspected influenza infection and who are at high risk of serious flu complications, such as people with asthma, diabetes (including gestational diabetes), or heart disease. Early treatment of influenza in hospitalized pregnant women has been shown to reduce the length of the hospital stay.  Symptoms Flu symptoms include fever, cough, sore throat, runny or stuffy nose, body aches, headache, chills and fatigue. Some people may also have vomiting and diarrhea. People may be infected with the flu and have respiratory symptoms without a fever.  Early Treatment is Important for Pregnant Women Treatment should begin as soon as possible because antiviral drugs work best when started early (within 48 hours  after symptoms start). Antiviral drugs can make your flu illness milder and make you feel better faster. They may also prevent serious health problems that can result from flu illness. Oral oseltamivir (Tamiflu) is the preferred treatment for pregnant women because it has the most studies available to suggest that it is safe and beneficial. Antiviral drugs require a prescription from your provider. Having a fever caused by flu infection or other infections early in pregnancy may be linked to birth defects in a baby. In addition to taking antiviral drugs, pregnant women who get a fever should treat their fever with Tylenol (acetaminophen) and contact their provider immediately.  When to [place] If you are pregnant and have any of these signs, seek care immediately:  Difficulty breathing or shortness of breath  Pain or pressure in the chest or abdomen  Sudden dizziness  Confusion  Severe or persistent vomiting  High fever that is not responding to Tylenol (or store brand equivalent)  Decreased or no movement of your baby  SolutionApps.[email or web address]

## 2019-12-16 LAB — AFP TETRA
DIA Mom Value: 0.99
DIA Value (EIA): 138.1 pg/mL
DSR (By Age)    1 IN: 613
DSR (Second Trimester) 1 IN: 7695
Gestational Age: 17 WEEKS
MSAFP Mom: 0.66
MSAFP: 23.4 ng/mL
MSHCG Mom: 0.55
MSHCG: 16100 m[IU]/mL
Maternal Age At EDD: 31 yr
Osb Risk: 10000
T18 (By Age): 1:2388 {titer}
Test Results:: NEGATIVE
Weight: 177 [lb_av]
uE3 Mom: 0.85
uE3 Value: 0.95 ng/mL

## 2019-12-16 LAB — URINE CULTURE: Organism ID, Bacteria: NO GROWTH

## 2019-12-22 ENCOUNTER — Other Ambulatory Visit: Payer: Self-pay

## 2019-12-22 ENCOUNTER — Ambulatory Visit (INDEPENDENT_AMBULATORY_CARE_PROVIDER_SITE_OTHER): Payer: BC Managed Care – PPO

## 2019-12-22 DIAGNOSIS — Z3A18 18 weeks gestation of pregnancy: Secondary | ICD-10-CM | POA: Diagnosis not present

## 2019-12-22 DIAGNOSIS — Z363 Encounter for antenatal screening for malformations: Secondary | ICD-10-CM

## 2019-12-22 NOTE — Progress Notes (Signed)
[place] 18+4 wks,cephalic,anterior placenta gr 0,normal ovaries,cx 3.7 cm,LVEICF 2.3 mm,fhr 144 bpm,EFW 264 g 65%,anatomy complete

## 2020-01-11 ENCOUNTER — Ambulatory Visit (INDEPENDENT_AMBULATORY_CARE_PROVIDER_SITE_OTHER): Payer: BC Managed Care – PPO | Admitting: Obstetrics & Gynecology

## 2020-01-11 ENCOUNTER — Encounter: Payer: Self-pay | Admitting: Obstetrics & Gynecology

## 2020-01-11 VITALS — BP 105/65 | HR 94 | Wt 183.0 lb

## 2020-01-11 DIAGNOSIS — O99322 Drug use complicating pregnancy, second trimester: Secondary | ICD-10-CM

## 2020-01-11 DIAGNOSIS — Z3402 Encounter for supervision of normal first pregnancy, second trimester: Secondary | ICD-10-CM

## 2020-01-11 DIAGNOSIS — Z3A21 21 weeks gestation of pregnancy: Secondary | ICD-10-CM

## 2020-01-11 NOTE — Progress Notes (Signed)
   HIGH-RISK PREGNANCY VISIT Patient name: Wendy Allen MRN [id number]  Date of birth: 01/23/1991 Chief Complaint:   Routine Prenatal Visit  History of Present Illness:   Wendy Allen is a 29 y.o. G2P0000 female at [redacted]w[redacted]d with an Estimated Date of Delivery: 05/20/20 being seen today for ongoing management of a high-risk pregnancy complicated by chronic opiate use, now off all tramadol.  Today she reports no complaints.  Depression screen [facility] 2/9 11/16/2019 10/07/2017 06/12/2016  Decreased Interest 0 0 0  Down, Depressed, Hopeless 0 0 0  PHQ - 2 Score 0 0 0  Altered sleeping 2 - -  Tired, decreased energy 2 - -  Change in appetite 1 - -  Feeling bad or failure about yourself  0 - -  Trouble concentrating 0 - -  Moving slowly or fidgety/restless 0 - -  Suicidal thoughts 0 - -  PHQ-9 Score 5 - -  Difficult doing work/chores Not difficult at all - -     . Vag. Bleeding: None.  Movement: Absent. denies leaking of fluid.  Review of Systems:   Pertinent items are noted in HPI Denies abnormal vaginal discharge w/ itching/odor/irritation, headaches, visual changes, shortness of breath, chest pain, abdominal pain, severe nausea/vomiting, or problems with urination or bowel movements unless otherwise stated above. Pertinent History Reviewed:  Reviewed past medical,surgical, social, obstetrical and family history.  Reviewed problem list, medications and allergies. Physical Assessment:   Vitals:   01/11/20 0931  BP: 105/65  Pulse: 94  Weight: 183 lb (83 kg)  Body mass index is 27.83 kg/m.           Physical Examination:   General appearance: alert, well appearing, and in no distress  Mental status: alert, oriented to person, place, and time  Skin: warm & dry   Extremities: Edema: Trace    Cardiovascular: normal heart rate noted  Respiratory: normal respiratory effort, no distress  Abdomen: gravid, soft, non-tender  Pelvic: Cervical exam deferred         Fetal Status:      Movement: Absent    Fetal Surveillance Testing today:    Chaperone:     No results found for this or any previous visit (from the past 24 hour(s)).  Assessment & Plan:  1) High-risk pregnancy G1P0000 at [redacted]w[redacted]d with an Estimated Date of Delivery: 05/20/20   2) opiate use in early pregnancy now off, s/p ankle fusion    Meds: No orders of the defined types were placed in this encounter.   Labs/procedures today:   Treatment Plan:  3 weeks then 3 weeks for PN2  Reviewed: Preterm labor symptoms and general obstetric precautions including but not limited to vaginal bleeding, contractions, leaking of fluid and fetal movement were reviewed in detail with the patient.  All questions were answered. Has home bp cuff. Rx faxed to . Check bp weekly, let us know if >140/90.   Follow-up: Return in about 3 years (around 01/11/2023) for LROB.  No orders of the defined types were placed in this encounter.  Lazaro Arms  01/11/2020 9:56 AM

## 2020-01-27 ENCOUNTER — Encounter: Payer: Self-pay | Admitting: Women's Health

## 2020-01-27 ENCOUNTER — Ambulatory Visit: Payer: BC Managed Care – PPO | Admitting: Women's Health

## 2020-01-27 VITALS — BP 123/79 | HR 92 | Wt 188.0 lb

## 2020-01-27 DIAGNOSIS — Z3402 Encounter for supervision of normal first pregnancy, second trimester: Secondary | ICD-10-CM

## 2020-01-27 DIAGNOSIS — Z1389 Encounter for screening for other disorder: Secondary | ICD-10-CM

## 2020-01-27 DIAGNOSIS — O26892 Other specified pregnancy related conditions, second trimester: Secondary | ICD-10-CM

## 2020-01-27 DIAGNOSIS — Z3A23 23 weeks gestation of pregnancy: Secondary | ICD-10-CM

## 2020-01-27 DIAGNOSIS — R42 Dizziness and giddiness: Secondary | ICD-10-CM

## 2020-01-27 LAB — POCT URINALYSIS DIPSTICK OB
Blood, UA: NEGATIVE
Glucose, UA: NEGATIVE
Ketones, UA: NEGATIVE
Leukocytes, UA: NEGATIVE
Nitrite, UA: NEGATIVE
POC,PROTEIN,UA: NEGATIVE

## 2020-01-27 NOTE — Progress Notes (Signed)
Work-in LOW-RISK PREGNANCY VISIT Patient name: Wendy Allen MRN [id number]  Date of birth: 1990/10/24 Chief Complaint:   Pain (w/i )  History of Present Illness:   Wendy Allen is a 29 y.o. G74P0000 female at 101w5d with an Estimated Date of Delivery: 05/20/20 being seen today for ongoing management of a low-risk pregnancy.  Depression screen [facility] 2/9 11/16/2019 10/07/2017 06/12/2016  Decreased Interest 0 0 0  Down, Depressed, Hopeless 0 0 0  PHQ - 2 Score 0 0 0  Altered sleeping 2 - -  Tired, decreased energy 2 - -  Change in appetite 1 - -  Feeling bad or failure about yourself  0 - -  Trouble concentrating 0 - -  Moving slowly or fidgety/restless 0 - -  Suicidal thoughts 0 - -  PHQ-9 Score 5 - -  Difficult doing work/chores Not difficult at all - -    Today she is being seen as a work-in for report of dizzy spells/feeling like she is going to pass out about once weekly, sometimes happens when she is only one at work- so can't sit/lay down until it passes. Wants to know if she can come out of work. Discussed would be 16+ weeks remaining in pregnancy then 6 weeks postpartum, FMLA only guarantees job for 12wks, she states her job told her they would save job for her. Also Lt lower back pain radiating to LLQ this am, was constant, took apap which only helped some, has eased up over day. No h/o kidney stones.  . Vag. Bleeding: None.  Movement: Present. denies leaking of fluid. Review of Systems:   Pertinent items are noted in HPI Denies abnormal vaginal discharge w/ itching/odor/irritation, headaches, visual changes, shortness of breath, chest pain, abdominal pain, severe nausea/vomiting, or problems with urination or bowel movements unless otherwise stated above. Pertinent History Reviewed:  Reviewed past medical,surgical, social, obstetrical and family history.  Reviewed problem list, medications and allergies. Physical Assessment:   Vitals:   01/27/20 1535  BP: 123/79    Pulse: 92  Weight: 188 lb (85.3 kg)  Body mass index is 28.59 kg/m.        Physical Examination:   General appearance: Well appearing, and in no distress  Mental status: Alert, oriented to person, place, and time  Skin: Warm & dry  Cardiovascular: Normal heart rate noted  Respiratory: Normal respiratory effort, no distress  Abdomen: Soft, gravid, nontender  Pelvic: Cervical exam deferred         Extremities: Edema: Trace  Fetal Status: Fetal Heart Rate (bpm): 140 Fundal Height: 23 cm Movement: Present    Chaperone: n/a    Results for orders placed or performed in visit on 01/27/20 (from the past 24 hour(s))  POC Urinalysis Dipstick OB   Collection Time: 01/27/20  3:40 PM  Result Value Ref Range   Color, UA     Clarity, UA     Glucose, UA Negative Negative   Bilirubin, UA     Ketones, UA neg    Spec Grav, UA     Blood, UA neg    pH, UA     POC,PROTEIN,UA Negative Negative, Trace, Small (1+), Moderate (2+), Large (3+), 4+   Urobilinogen, UA     Nitrite, UA neg    Leukocytes, UA Negative Negative   Appearance     Odor      Assessment & Plan:  1) Low-risk pregnancy G1P0000 at [redacted]w[redacted]d with an Estimated Date of Delivery: 05/20/20  2) Dizzy spells, increase water & protein intake, check CBC. 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 Is ok to work when 2nd person there w/ her.   3) Lt lower back pain radiating to LLQ> improved now, possibly kidney stone but no hematuria, plenty of water to flush kidneys. Let us know/go to [facility] if becomes severe   Meds: No orders of the defined types were placed in this encounter.  Labs/procedures today: cbc  Plan:  Continue routine obstetrical care   Reviewed: Preterm labor symptoms and general obstetric precautions including but not limited to vaginal bleeding, contractions, leaking of fluid and fetal movement were reviewed in detail with the patient.  All questions were answered.   Follow-up: Return for As scheduled. on Monday  Orders Placed This Encounter  Procedures  . CBC  . POC Urinalysis Dipstick OB   Cheral Marker CNM, [facility] 01/27/2020 5:03 PM

## 2020-01-27 NOTE — Patient Instructions (Signed)
Wendy Allen, I greatly value your feedback.  If you receive a survey following your visit with [place] today, we appreciate you taking the time to fill it out.  Thanks, Wendy Allen, Wendy Allen, Wendy Allen  [facility] ([address] [place], [place] [address]) Entrance C, located off of [address] valet parking  Go to [email or web address] to register for FREE online childbirth classes  [place] Pediatricians/Family Doctors:  [facility] [phone]            [facility] [phone]                 [facility] [phone] (usually not accepting new patients unless you have family there already, you are always welcome to call and ask)       [facility] Department [phone]       [facility] Pediatricians/Family Doctors:   [facility]: [phone]  [facility]: [phone]  [facility]: [phone]  [facility] Doctors:   [facility]: [phone]   [facility]: [phone]  [facility] Doctors:  [facility]: [phone]    Home Blood Pressure Monitoring for Patients   Your provider has recommended that you check your blood pressure (BP) at least once a week at home. If you do not have a blood pressure cuff at home, one will be provided for you. Contact your provider if you have not received your monitor within 1 week.   Helpful Tips for Accurate Home Blood Pressure Checks  . Don't smoke, exercise, or drink caffeine 30 minutes before checking your BP . Use the restroom before checking your BP (a full bladder can raise your pressure) . Relax in a comfortable upright chair . Feet on the ground . Left arm resting comfortably on a flat surface at the level of your heart . Legs uncrossed . Back supported . Sit quietly and don't talk . Place the cuff on your bare arm . Adjust snuggly,  so that only two fingertips can fit between your skin and the top of the cuff . Check 2 readings separated by at least one minute . Keep a log of your BP readings . For a visual, please reference this diagram: http://ccnc.care/bpdiagram  Provider Name: [facility]     Phone: [phone]  Zone 1: ALL CLEAR  Continue to monitor your symptoms:  . BP reading is less than 140 (top number) or less than 90 (bottom number)  . No right upper stomach pain . No headaches or seeing spots . No feeling nauseated or throwing up . No swelling in face and hands  Zone 2: CAUTION Call your doctor's office for any of the following:  . BP reading is greater than 140 (top number) or greater than 90 (bottom number)  . Stomach pain under your ribs in the middle or right side . Headaches or seeing spots . Feeling nauseated or throwing up . Swelling in face and hands  Zone 3: EMERGENCY  Seek immediate medical care if you have any of the following:  . BP reading is greater than160 (top number) or greater than 110 (bottom number) . Severe headaches not improving with Tylenol . Serious difficulty catching your breath . Any worsening symptoms from Zone 2     Second Trimester of Pregnancy The second trimester is from week 14 through week 27 (months 4 through 6). The second trimester is often a time when you feel your  best. Your body has adjusted to being pregnant, and you begin to feel better physically. Usually, morning sickness has lessened or quit completely, you may have more energy, and you may have an increase in appetite. The second trimester is also a time when the fetus is growing rapidly. At the end of the sixth month, the fetus is about 9 inches long and weighs about 1 pounds. You will likely begin to feel the baby move (quickening) between 16 and 20 weeks of pregnancy. Body changes during your second trimester Your body continues to go through many changes during your second trimester. The  changes vary from woman to woman.  Your weight will continue to increase. You will notice your lower abdomen bulging out.  You may begin to get stretch marks on your hips, abdomen, and breasts.  You may develop headaches that can be relieved by medicines. The medicines should be approved by your health care provider.  You may urinate more often because the fetus is pressing on your bladder.  You may develop or continue to have heartburn as a result of your pregnancy.  You may develop constipation because certain hormones are causing the muscles that push waste through your intestines to slow down.  You may develop hemorrhoids or swollen, bulging veins (varicose veins).  You may have back pain. This is caused by: ? Weight gain. ? Pregnancy hormones that are relaxing the joints in your pelvis. ? A shift in weight and the muscles that support your balance.  Your breasts will continue to grow and they will continue to become tender.  Your gums may bleed and may be sensitive to brushing and flossing.  Dark spots or blotches (chloasma, mask of pregnancy) may develop on your face. This will likely fade after the baby is born.  A dark line from your belly button to the pubic area (linea nigra) may appear. This will likely fade after the baby is born.  You may have changes in your hair. These can include thickening of your hair, rapid growth, and changes in texture. Some women also have hair loss during or after pregnancy, or hair that feels dry or thin. Your hair will most likely return to normal after your baby is born.  What to expect at prenatal visits During a routine prenatal visit:  You will be weighed to make sure you and the fetus are growing normally.  Your blood pressure will be taken.  Your abdomen will be measured to track your baby's growth.  The fetal heartbeat will be listened to.  Any test results from the previous visit will be discussed.  Your health care  provider may ask you:  How you are feeling.  If you are feeling the baby move.  If you have had any abnormal symptoms, such as leaking fluid, bleeding, severe headaches, or abdominal cramping.  If you are using any tobacco products, including cigarettes, chewing tobacco, and electronic cigarettes.  If you have any questions.  Other tests that may be performed during your second trimester include:  Blood tests that check for: ? Low iron levels (anemia). ? High blood sugar that affects pregnant women (gestational diabetes) between 71 and 28 weeks. ? Rh antibodies. This is to check for a protein on red blood cells (Rh factor).  Urine tests to check for infections, diabetes, or protein in the urine.  An ultrasound to confirm the proper growth and development of the baby.  An amniocentesis to check for possible genetic problems.  Fetal  screens for spina bifida and Down syndrome.  HIV (human immunodeficiency virus) testing. Routine prenatal testing includes screening for HIV, unless you choose not to have this test.  Follow these instructions at home: Medicines  Follow your health care provider's instructions regarding medicine use. Specific medicines may be either safe or unsafe to take during pregnancy.  Take a prenatal vitamin that contains at least 600 micrograms (mcg) of folic acid.  If you develop constipation, try taking a stool softener if your health care provider approves. Eating and drinking  Eat a balanced diet that includes fresh fruits and vegetables, whole grains, good sources of protein such as meat, eggs, or tofu, and low-fat dairy. Your health care provider will help you determine the amount of weight gain that is right for you.  Avoid raw meat and uncooked cheese. These carry germs that can cause birth defects in the baby.  If you have low calcium intake from food, talk to your health care provider about whether you should take a daily calcium  supplement.  Limit foods that are high in fat and processed sugars, such as fried and sweet foods.  To prevent constipation: ? Drink enough fluid to keep your urine clear or pale yellow. ? Eat foods that are high in fiber, such as fresh fruits and vegetables, whole grains, and beans. Activity  Exercise only as directed by your health care provider. Most women can continue their usual exercise routine during pregnancy. Try to exercise for 30 minutes at least 5 days a week. Stop exercising if you experience uterine contractions.  Avoid heavy lifting, wear low heel shoes, and practice good posture.  A sexual relationship may be continued unless your health care provider directs you otherwise. Relieving pain and discomfort  Wear a good support bra to prevent discomfort from breast tenderness.  Take warm sitz baths to soothe any pain or discomfort caused by hemorrhoids. Use hemorrhoid cream if your health care provider approves.  Rest with your legs elevated if you have leg cramps or low back pain.  If you develop varicose veins, wear support hose. Elevate your feet for 15 minutes, 3-4 times a day. Limit salt in your diet. Prenatal Care  Write down your questions. Take them to your prenatal visits.  Keep all your prenatal visits as told by your health care provider. This is important. Safety  Wear your seat belt at all times when driving.  Make a list of emergency phone numbers, including numbers for family, friends, the hospital, and police and fire departments. General instructions  Ask your health care provider for a referral to a local prenatal education class. Begin classes no later than the beginning of month 6 of your pregnancy.  Ask for help if you have counseling or nutritional needs during pregnancy. Your health care provider can offer advice or refer you to specialists for help with various needs.  Do not use hot tubs, steam rooms, or saunas.  Do not douche or use  tampons or scented sanitary pads.  Do not cross your legs for long periods of time.  Avoid cat litter boxes and soil used by cats. These carry germs that can cause birth defects in the baby and possibly loss of the fetus by miscarriage or stillbirth.  Avoid all smoking, herbs, alcohol, and unprescribed drugs. Chemicals in these products can affect the formation and growth of the baby.  Do not use any products that contain nicotine or tobacco, such as cigarettes and e-cigarettes. If you need help  quitting, ask your health care provider.  Visit your dentist if you have not gone yet during your pregnancy. Use a soft toothbrush to brush your teeth and be gentle when you floss. Contact a health care provider if:  You have dizziness.  You have mild pelvic cramps, pelvic pressure, or nagging pain in the abdominal area.  You have persistent nausea, vomiting, or diarrhea.  You have a bad smelling vaginal discharge.  You have pain when you urinate. Get help right away if:  You have a fever.  You are leaking fluid from your vagina.  You have spotting or bleeding from your vagina.  You have severe abdominal cramping or pain.  You have rapid weight gain or weight loss.  You have shortness of breath with chest pain.  You notice sudden or extreme swelling of your face, hands, ankles, feet, or legs.  You have not felt your baby move in over an hour.  You have severe headaches that do not go away when you take medicine.  You have vision changes. Summary  The second trimester is from week 14 through week 27 (months 4 through 6). It is also a time when the fetus is growing rapidly.  Your body goes through many changes during pregnancy. The changes vary from woman to woman.  Avoid all smoking, herbs, alcohol, and unprescribed drugs. These chemicals affect the formation and growth your baby.  Do not use any tobacco products, such as cigarettes, chewing tobacco, and e-cigarettes. If you  need help quitting, ask your health care provider.  Contact your health care provider if you have any questions. Keep all prenatal visits as told by your health care provider. This is important. This information is not intended to replace advice given to you by your health care provider. Make sure you discuss any questions you have with your health care provider. Document Released: 07/31/2001 Document Revised: 01/12/2016 Document Reviewed: 10/07/2012 Elsevier Interactive Patient Education  2017 [place] FLU! Because you are pregnant, we at [facility], along with the Centers for Disease Control (CDC), recommend that you receive the flu vaccine to protect yourself and your baby from the flu. The flu is more likely to cause severe illness in pregnant women than in women of reproductive age who are not pregnant. Changes in the immune system, heart, and lungs during pregnancy make pregnant women (and women up to two weeks postpartum) more prone to severe illness from flu, including illness resulting in hospitalization. Flu also may be harmful for a pregnant woman's developing baby. A common flu symptom is fever, which may be associated with neural tube defects and other adverse outcomes for a developing baby. Getting vaccinated can also help protect a baby after birth from flu. (Mom passes antibodies onto the developing baby during her pregnancy.)  A Flu Vaccine is the Best Protection Against Flu Getting a flu vaccine is the first and most important step in protecting against flu. Pregnant women should get a flu shot and not the live attenuated influenza vaccine (LAIV), also known as nasal spray flu vaccine. Flu vaccines given during pregnancy help protect both the mother and her baby from flu. Vaccination has been shown to reduce the risk of flu-associated acute respiratory infection in pregnant women by up to one-half. A 2018 study showed that getting a flu shot  reduced a pregnant woman's risk of being hospitalized with flu by an average of 40 percent. Pregnant women who get a  flu vaccine are also helping to protect their babies from flu illness for the first several months after their birth, when they are too young to get vaccinated.   A Long Record of Safety for Flu Shots in Pregnant Women Flu shots have been given to millions of pregnant women over many years with a good safety record. There is a lot of evidence that flu vaccines can be given safely during pregnancy; though these data are limited for the first trimester. The CDC recommends that pregnant women get vaccinated during any trimester of their pregnancy. It is very important for pregnant women to get the flu shot.   Other Preventive Actions In addition to getting a flu shot, pregnant women should take the same everyday preventive actions the CDC recommends of everyone, including covering coughs, washing hands often, and avoiding people who are sick.  Symptoms and Treatment If you get sick with flu symptoms call your doctor right away. There are antiviral drugs that can treat flu illness and prevent serious flu complications. The CDC recommends prompt treatment for people who have influenza infection or suspected influenza infection and who are at high risk of serious flu complications, such as people with asthma, diabetes (including gestational diabetes), or heart disease. Early treatment of influenza in hospitalized pregnant women has been shown to reduce the length of the hospital stay.  Symptoms Flu symptoms include fever, cough, sore throat, runny or stuffy nose, body aches, headache, chills and fatigue. Some people may also have vomiting and diarrhea. People may be infected with the flu and have respiratory symptoms without a fever.  Early Treatment is Important for Pregnant Women Treatment should begin as soon as possible because antiviral drugs work best when started early (within 48 hours  after symptoms start). Antiviral drugs can make your flu illness milder and make you feel better faster. They may also prevent serious health problems that can result from flu illness. Oral oseltamivir (Tamiflu) is the preferred treatment for pregnant women because it has the most studies available to suggest that it is safe and beneficial. Antiviral drugs require a prescription from your provider. Having a fever caused by flu infection or other infections early in pregnancy may be linked to birth defects in a baby. In addition to taking antiviral drugs, pregnant women who get a fever should treat their fever with Tylenol (acetaminophen) and contact their provider immediately.  When to Seek Emergency Medical Care If you are pregnant and have any of these signs, seek care immediately:  Difficulty breathing or shortness of breath  Pain or pressure in the chest or abdomen  Sudden dizziness  Confusion  Severe or persistent vomiting  High fever that is not responding to Tylenol (or store brand equivalent)  Decreased or no movement of your baby  https://www.cdc.gov/flu/protect/vaccine/pregnant.htm      

## 2020-01-28 LAB — CBC
Hematocrit: 34.6 % (ref 34.0–46.6)
Hemoglobin: 12.1 g/dL (ref 11.1–15.9)
MCH: 30.8 pg (ref 26.6–33.0)
MCHC: 35 g/dL (ref 31.5–35.7)
MCV: 88 fL (ref 79–97)
Platelets: 229 10*3/uL (ref 150–450)
RBC: 3.93 x10E6/uL (ref 3.77–5.28)
RDW: 13.1 % (ref 11.7–15.4)
WBC: 13.1 10*3/uL — ABNORMAL HIGH (ref 3.4–10.8)

## 2020-02-01 ENCOUNTER — Ambulatory Visit (INDEPENDENT_AMBULATORY_CARE_PROVIDER_SITE_OTHER): Payer: BC Managed Care – PPO | Admitting: Women's Health

## 2020-02-01 ENCOUNTER — Encounter: Payer: Self-pay | Admitting: Women's Health

## 2020-02-01 VITALS — BP 115/68 | HR 109 | Wt 187.0 lb

## 2020-02-01 DIAGNOSIS — Z3402 Encounter for supervision of normal first pregnancy, second trimester: Secondary | ICD-10-CM

## 2020-02-01 DIAGNOSIS — Z1389 Encounter for screening for other disorder: Secondary | ICD-10-CM

## 2020-02-01 DIAGNOSIS — Z331 Pregnant state, incidental: Secondary | ICD-10-CM

## 2020-02-01 DIAGNOSIS — Z3A24 24 weeks gestation of pregnancy: Secondary | ICD-10-CM

## 2020-02-01 LAB — POCT URINALYSIS DIPSTICK OB
Blood, UA: NEGATIVE
Glucose, UA: NEGATIVE
Ketones, UA: NEGATIVE
Leukocytes, UA: NEGATIVE
Nitrite, UA: NEGATIVE
POC,PROTEIN,UA: NEGATIVE

## 2020-02-01 NOTE — Progress Notes (Signed)
LOW-RISK PREGNANCY VISIT Patient name: Wendy Allen MRN [id number]  Date of birth: 05/08/1991 Chief Complaint:   Routine Prenatal Visit  History of Present Illness:   Wendy Allen is a 29 y.o. G55P0000 female at [redacted]w[redacted]d with an Estimated Date of Delivery: 05/20/20 being seen today for ongoing management of a low-risk pregnancy.  Depression screen [facility] 2/9 11/16/2019 10/07/2017 06/12/2016  Decreased Interest 0 0 0  Down, Depressed, Hopeless 0 0 0  PHQ - 2 Score 0 0 0  Altered sleeping 2 - -  Tired, decreased energy 2 - -  Change in appetite 1 - -  Feeling bad or failure about yourself  0 - -  Trouble concentrating 0 - -  Moving slowly or fidgety/restless 0 - -  Suicidal thoughts 0 - -  PHQ-9 Score 5 - -  Difficult doing work/chores Not difficult at all - -    Today she reports dizzy spells better, hasn't had one since last visit. . Contractions: Not present. Vag. Bleeding: None.  Movement: Present. denies leaking of fluid. Review of Systems:   Pertinent items are noted in HPI Denies abnormal vaginal discharge w/ itching/odor/irritation, headaches, visual changes, shortness of breath, chest pain, abdominal pain, severe nausea/vomiting, or problems with urination or bowel movements unless otherwise stated above. Pertinent History Reviewed:  Reviewed past medical,surgical, social, obstetrical and family history.  Reviewed problem list, medications and allergies. Physical Assessment:   Vitals:   02/01/20 0927  BP: 115/68  Pulse: (!) 109  Weight: 187 lb (84.8 kg)  Body mass index is 28.43 kg/m.        Physical Examination:   General appearance: Well appearing, and in no distress  Mental status: Alert, oriented to person, place, and time  Skin: Warm & dry  Cardiovascular: Normal heart rate noted  Respiratory: Normal respiratory effort, no distress  Abdomen: Soft, gravid, nontender  Pelvic: Cervical exam deferred         Extremities: Edema: Trace  Fetal Status: Fetal  Heart Rate (bpm): 147 Fundal Height: 24 cm Movement: Present    Chaperone: n/a    Results for orders placed or performed in visit on 02/01/20 (from the past 24 hour(s))  POC Urinalysis Dipstick OB   Collection Time: 02/01/20  9:36 AM  Result Value Ref Range   Color, UA     Clarity, UA     Glucose, UA Negative Negative   Bilirubin, UA     Ketones, UA neg    Spec Grav, UA     Blood, UA neg    pH, UA     POC,PROTEIN,UA Negative Negative, Trace, Small (1+), Moderate (2+), Large (3+), 4+   Urobilinogen, UA     Nitrite, UA neg    Leukocytes, UA Negative Negative   Appearance     Odor      Assessment & Plan:  1) Low-risk pregnancy G1P0000 at [redacted]w[redacted]d with an Estimated Date of Delivery: 05/20/20   2) Dizzy spells, improving   Meds: No orders of the defined types were placed in this encounter.  Labs/procedures today: none  Plan:  Continue routine obstetrical care  Next visit: prefers will be in person for pn2    Reviewed: Preterm labor symptoms and general obstetric precautions including but not limited to vaginal bleeding, contractions, leaking of fluid and fetal movement were reviewed in detail with the patient.  All questions were answered.   Follow-up: Return in about 4 weeks (around 02/29/2020) for LROB, PN2, CNM, in person.  Orders Placed This Encounter  Procedures  . POC Urinalysis Dipstick OB   Roma Schanz CNM, [facility] 02/01/2020 9:58 AM

## 2020-02-01 NOTE — Patient Instructions (Signed)
Wendy Allen, I greatly value your feedback.  If you receive a survey following your visit with [place] today, we appreciate you taking the time to fill it out.  Thanks, Wendy Allen, CNM, WHNP-BC   You will have your sugar test next visit.  Please do not eat or drink anything after midnight the night before you come, not even water.  You will be here for at least two hours.  Please make an appointment online for the bloodwork at [email or web address] for 8:30am (or as close to this as possible). Make sure you select the [facility] service center. The day of the appointment, check in with our office first, then you will go to [facility] to start the sugar test.    [facility] [place], [place] [address]) Entrance C, located off of [address] valet parking  Go to [email or web address] to register for FREE online childbirth classes   Call the office [phone]) or go to [facility] if:  You begin to have strong, frequent contractions  Your water breaks.  Sometimes it is a big gush of fluid, sometimes it is just a trickle that keeps getting your panties wet or running down your legs  You have vaginal bleeding.  It is normal to have a small amount of spotting if your cervix was checked.   You don't feel your baby moving like normal.  If you don't, get you something to eat and drink and lay down and focus on feeling your baby move.   If your baby is still not moving like normal, you should call the office or go to [facility].  [place] Pediatricians/Family Doctors:  [facility] [phone]            [facility] [phone]                 [facility] [phone] (usually not accepting new patients unless you have family there already, you are always welcome to call and ask)       [facility] [phone]       [facility] Pediatricians/Family Doctors:   [facility]:  [phone]  [facility]: [phone]  [facility]: [phone]  [facility] Doctors:   [facility]: [phone]   [facility]: [phone]  [facility] Doctors:  [facility]: [phone]   Home Blood Pressure Monitoring for Patients   Your provider has recommended that you check your blood pressure (BP) at least once a week at home. If you do not have a blood pressure cuff at home, one will be provided for you. Contact your provider if you have not received your monitor within 1 week.   Helpful Tips for Accurate Home Blood Pressure Checks  . Don't smoke, exercise, or drink caffeine 30 minutes before checking your BP . Use the restroom before checking your BP (a full bladder can raise your pressure) . Relax in a comfortable upright chair . Feet on the ground . Left arm resting comfortably on a flat surface at the level of your heart . Legs uncrossed . Back supported . Sit quietly and don't talk . Place the cuff on your bare arm . Adjust snuggly, so that only two fingertips can fit between your skin and the top of the cuff . Check 2 readings separated by at least one minute . Keep a log of your BP readings . For a visual, please  reference this diagram: http://ccnc.care/bpdiagram  Provider Name: [facility]     Phone: [phone]  Zone 1: ALL CLEAR  Continue to monitor your symptoms:  . BP reading is less than 140 (top number) or less than 90 (bottom number)  . No right upper stomach pain . No headaches or seeing spots . No feeling nauseated or throwing up . No swelling in face and hands  Zone 2: CAUTION Call your doctor's office for any of the following:  . BP reading is greater than 140 (top number) or greater than 90 (bottom number)  . Stomach pain under your ribs in the middle or right side . Headaches or seeing spots . Feeling nauseated or throwing up . Swelling in  face and hands  Zone 3: EMERGENCY  Seek immediate medical care if you have any of the following:  . BP reading is greater than160 (top number) or greater than 110 (bottom number) . Severe headaches not improving with Tylenol . Serious difficulty catching your breath . Any worsening symptoms from Zone 2   Second Trimester of Pregnancy The second trimester is from week 13 through week 28, months 4 through 6. The second trimester is often a time when you feel your best. Your body has also adjusted to being pregnant, and you begin to feel better physically. Usually, morning sickness has lessened or quit completely, you may have more energy, and you may have an increase in appetite. The second trimester is also a time when the fetus is growing rapidly. At the end of the sixth month, the fetus is about 9 inches long and weighs about 1 pounds. You will likely begin to feel the baby move (quickening) between 18 and 20 weeks of the pregnancy. BODY CHANGES Your body goes through many changes during pregnancy. The changes vary from woman to woman.   Your weight will continue to increase. You will notice your lower abdomen bulging out.  You may begin to get stretch marks on your hips, abdomen, and breasts.  You may develop headaches that can be relieved by medicines approved by your health care provider.  You may urinate more often because the fetus is pressing on your bladder.  You may develop or continue to have heartburn as a result of your pregnancy.  You may develop constipation because certain hormones are causing the muscles that push waste through your intestines to slow down.  You may develop hemorrhoids or swollen, bulging veins (varicose veins).  You may have back pain because of the weight gain and pregnancy hormones relaxing your joints between the bones in your pelvis and as a result of a shift in weight and the muscles that support your balance.  Your breasts will continue to grow  and be tender.  Your gums may bleed and may be sensitive to brushing and flossing.  Dark spots or blotches (chloasma, mask of pregnancy) may develop on your face. This will likely fade after the baby is born.  A dark line from your belly button to the pubic area (linea nigra) may appear. This will likely fade after the baby is born.  You may have changes in your hair. These can include thickening of your hair, rapid growth, and changes in texture. Some women also have hair loss during or after pregnancy, or hair that feels dry or thin. Your hair will most likely return to normal after your baby is born. WHAT TO EXPECT AT YOUR PRENATAL VISITS During a routine prenatal visit:  You  will be weighed to make sure you and the fetus are growing normally.  Your blood pressure will be taken.  Your abdomen will be measured to track your baby's growth.  The fetal heartbeat will be listened to.  Any test results from the previous visit will be discussed. Your health care provider may ask you:  How you are feeling.  If you are feeling the baby move.  If you have had any abnormal symptoms, such as leaking fluid, bleeding, severe headaches, or abdominal cramping.  If you have any questions. Other tests that may be performed during your second trimester include:  Blood tests that check for:  Low iron levels (anemia).  Gestational diabetes (between 24 and 28 weeks).  Rh antibodies.  Urine tests to check for infections, diabetes, or protein in the urine.  An ultrasound to confirm the proper growth and development of the baby.  An amniocentesis to check for possible genetic problems.  Fetal screens for spina bifida and Down syndrome. HOME CARE INSTRUCTIONS   Avoid all smoking, herbs, alcohol, and unprescribed drugs. These chemicals affect the formation and growth of the baby.  Follow your health care provider's instructions regarding medicine use. There are medicines that are either  safe or unsafe to take during pregnancy.  Exercise only as directed by your health care provider. Experiencing uterine cramps is a good sign to stop exercising.  Continue to eat regular, healthy meals.  Wear a good support bra for breast tenderness.  Do not use hot tubs, steam rooms, or saunas.  Wear your seat belt at all times when driving.  Avoid raw meat, uncooked cheese, cat litter boxes, and soil used by cats. These carry germs that can cause birth defects in the baby.  Take your prenatal vitamins.  Try taking a stool softener (if your health care provider approves) if you develop constipation. Eat more high-fiber foods, such as fresh vegetables or fruit and whole grains. Drink plenty of fluids to keep your urine clear or pale yellow.  Take warm sitz baths to soothe any pain or discomfort caused by hemorrhoids. Use hemorrhoid cream if your health care provider approves.  If you develop varicose veins, wear support hose. Elevate your feet for 15 minutes, 3-4 times a day. Limit salt in your diet.  Avoid heavy lifting, wear low heel shoes, and practice good posture.  Rest with your legs elevated if you have leg cramps or low back pain.  Visit your dentist if you have not gone yet during your pregnancy. Use a soft toothbrush to brush your teeth and be gentle when you floss.  A sexual relationship may be continued unless your health care provider directs you otherwise.  Continue to go to all your prenatal visits as directed by your health care provider. SEEK MEDICAL CARE IF:   You have dizziness.  You have mild pelvic cramps, pelvic pressure, or nagging pain in the abdominal area.  You have persistent nausea, vomiting, or diarrhea.  You have a bad smelling vaginal discharge.  You have pain with urination. SEEK IMMEDIATE MEDICAL CARE IF:   You have a fever.  You are leaking fluid from your vagina.  You have spotting or bleeding from your vagina.  You have severe  abdominal cramping or pain.  You have rapid weight gain or loss.  You have shortness of breath with chest pain.  You notice sudden or extreme swelling of your face, hands, ankles, feet, or legs.  You have not felt your baby move  in over an hour.  You have severe headaches that do not go away with medicine.  You have vision changes. Document Released: 07/31/2001 Document Revised: 08/11/2013 Document Reviewed: 10/07/2012 [facility] Patient Information 2015 [place], [place]. This information is not intended to replace advice given to you by your health care provider. Make sure you discuss any questions you have with your health care provider.

## 2020-02-05 ENCOUNTER — Telehealth: Payer: Self-pay | Admitting: Women's Health

## 2020-02-05 NOTE — Telephone Encounter (Signed)
Pt is requesting a note to be taken out of work. Pt states she discussed her work issues with you. Please advise. Thanks! JSY

## 2020-02-05 NOTE — Telephone Encounter (Signed)
Patient wants a response from her my chart message today if possible.

## 2020-02-08 ENCOUNTER — Other Ambulatory Visit: Payer: Self-pay | Admitting: Women's Health

## 2020-02-18 DIAGNOSIS — Z029 Encounter for administrative examinations, unspecified: Secondary | ICD-10-CM

## 2020-02-29 ENCOUNTER — Other Ambulatory Visit: Payer: BC Managed Care – PPO

## 2020-02-29 ENCOUNTER — Encounter: Payer: Self-pay | Admitting: Women's Health

## 2020-02-29 ENCOUNTER — Ambulatory Visit (INDEPENDENT_AMBULATORY_CARE_PROVIDER_SITE_OTHER): Payer: BC Managed Care – PPO | Admitting: Women's Health

## 2020-02-29 VITALS — BP 114/73 | HR 95 | Wt 189.8 lb

## 2020-02-29 DIAGNOSIS — Z3A28 28 weeks gestation of pregnancy: Secondary | ICD-10-CM

## 2020-02-29 DIAGNOSIS — Z3403 Encounter for supervision of normal first pregnancy, third trimester: Secondary | ICD-10-CM

## 2020-02-29 DIAGNOSIS — N898 Other specified noninflammatory disorders of vagina: Secondary | ICD-10-CM

## 2020-02-29 DIAGNOSIS — Z1389 Encounter for screening for other disorder: Secondary | ICD-10-CM

## 2020-02-29 DIAGNOSIS — O98813 Other maternal infectious and parasitic diseases complicating pregnancy, third trimester: Secondary | ICD-10-CM

## 2020-02-29 DIAGNOSIS — Z331 Pregnant state, incidental: Secondary | ICD-10-CM

## 2020-02-29 DIAGNOSIS — B373 Candidiasis of vulva and vagina: Secondary | ICD-10-CM

## 2020-02-29 DIAGNOSIS — Z72820 Sleep deprivation: Secondary | ICD-10-CM

## 2020-02-29 DIAGNOSIS — Z23 Encounter for immunization: Secondary | ICD-10-CM | POA: Diagnosis not present

## 2020-02-29 LAB — POCT URINALYSIS DIPSTICK OB
Blood, UA: NEGATIVE
Glucose, UA: NEGATIVE
Ketones, UA: NEGATIVE
Leukocytes, UA: NEGATIVE
Nitrite, UA: NEGATIVE
POC,PROTEIN,UA: NEGATIVE

## 2020-02-29 LAB — POCT WET PREP (WET MOUNT)
Clue Cells Wet Prep Whiff POC: NEGATIVE
Trichomonas Wet Prep HPF POC: ABSENT

## 2020-02-29 NOTE — Progress Notes (Signed)
LOW-RISK PREGNANCY VISIT Patient name: Wendy Allen MRN [id number]  Date of birth: [birth year] Chief Complaint:   Routine Prenatal Visit (PN2)  History of Present Illness:   Wendy Allen is a 29 y.o. G73P0000 female at [redacted]w[redacted]d with an Estimated Date of Delivery: 05/20/20 being seen today for ongoing management of a low-risk pregnancy.  Depression screen [facility] 2/9 02/29/2020 11/16/2019 10/07/2017 06/12/2016  Decreased Interest 0 0 0 0  Down, Depressed, Hopeless 0 0 0 0  PHQ - 2 Score 0 0 0 0  Altered sleeping 0 2 - -  Tired, decreased energy 2 2 - -  Change in appetite 0 1 - -  Feeling bad or failure about yourself  0 0 - -  Trouble concentrating 0 0 - -  Moving slowly or fidgety/restless 0 0 - -  Suicidal thoughts 0 0 - -  PHQ-9 Score 2 5 - -  Difficult doing work/chores - Not difficult at all - -    Today she reports dizzy spells have improved since she's been out of work- thinks it was more stress related. Hard time sleeping/getting comfortable. Thinks she has yeast infection, vaginal itching, thick d/c x few days. Started monistat 7 the other day and is improving. Contractions: Not present. Vag. Bleeding: None.  Movement: Present. denies leaking of fluid. Review of Systems:   Pertinent items are noted in HPI Denies abnormal vaginal discharge w/ itching/odor/irritation, headaches, visual changes, shortness of breath, chest pain, abdominal pain, severe nausea/vomiting, or problems with urination or bowel movements unless otherwise stated above. Pertinent History Reviewed:  Reviewed past medical,surgical, social, obstetrical and family history.  Reviewed problem list, medications and allergies. Physical Assessment:   Vitals:   02/29/20 0956  BP: 114/73  Pulse: 95  Weight: 189 lb 12.8 oz (86.1 kg)  Body mass index is 28.86 kg/m.        Physical Examination:   General appearance: Well appearing, and in no distress  Mental status: Alert, oriented to person, place, and  time  Skin: Warm & dry  Cardiovascular: Normal heart rate noted  Respiratory: Normal respiratory effort, no distress  Abdomen: Soft, gravid, nontender  Pelvic: spec exam: cervix visually long and closed, small amt thick nonodorous d/c         Extremities: Edema: Trace  Fetal Status: Fetal Heart Rate (bpm): 140 Fundal Height: 28 cm Movement: Present    Chaperone: Amanda Rash    Results for orders placed or performed in visit on 02/29/20 (from the past 24 hour(s))  POC Urinalysis Dipstick OB   Collection Time: 02/29/20 10:07 AM  Result Value Ref Range   Color, UA     Clarity, UA     Glucose, UA Negative Negative   Bilirubin, UA     Ketones, UA neg    Spec Grav, UA     Blood, UA neg    pH, UA     POC,PROTEIN,UA Negative Negative, Trace, Small (1+), Moderate (2+), Large (3+), 4+   Urobilinogen, UA     Nitrite, UA neg    Leukocytes, UA Negative Negative   Appearance     Odor    POCT Wet Prep [facility] [place])   Collection Time: 02/29/20 10:28 AM  Result Value Ref Range   Source Wet Prep POC vaginal    WBC, Wet Prep HPF POC few    Bacteria Wet Prep HPF POC Few Few   BACTERIA WET PREP MORPHOLOGY POC     Clue Cells Wet Prep HPF  POC None None   Clue Cells Wet Prep Whiff POC Negative Whiff    Yeast Wet Prep HPF POC Few (A) None   KOH Wet Prep POC     Trichomonas Wet Prep HPF POC Absent Absent    Assessment & Plan:  1) Low-risk pregnancy G1P0000 at [redacted]w[redacted]d with an Estimated Date of Delivery: 05/20/20   2) Vaginal candida, continue monistat 7  3) Trouble sleeping> gave printed prevention/relief measures    Meds: No orders of the defined types were placed in this encounter.  Labs/procedures today: pn2, tdap  Plan:  Continue routine obstetrical care  Next visit: prefers in person, rhogam  Reviewed: Preterm labor symptoms and general obstetric precautions including but not limited to vaginal bleeding, contractions, leaking of fluid and fetal movement were reviewed in detail with the  patient.  All questions were answered.   Follow-up: Return in about 4 weeks (around 03/28/2020) for LROB, in person, CNM.  Orders Placed This Encounter  Procedures   Tdap vaccine greater than or equal to 7yo IM   POC Urinalysis Dipstick OB   POCT Wet Prep ([address])   Cheral Marker CNM, [facility] 02/29/2020 10:29 AM

## 2020-02-29 NOTE — Patient Instructions (Addendum)
Wendy Allen, I greatly value your feedback.  If you receive a survey following your visit with [place] today, we appreciate you taking the time to fill it out.  Thanks, Wendy Allen, CNM, WHNP-BC   [facility] ([address] [place], [place] [address]) Entrance C, located off of [address] valet parking  Go to [email or web address] to register for FREE online childbirth classes   Call the office [phone]) or go to [facility] if:  You begin to have strong, frequent contractions  Your water breaks.  Sometimes it is a big gush of fluid, sometimes it is just a trickle that keeps getting your panties wet or running down your legs  You have vaginal bleeding.  It is normal to have a small amount of spotting if your cervix was checked.   You don't feel your baby moving like normal.  If you don't, get you something to eat and drink and lay down and focus on feeling your baby move.  You should feel at least 10 movements in 2 hours.  If you don't, you should call the office or go to [facility].    Tdap Vaccine  It is recommended that you get the Tdap vaccine during the third trimester of EACH pregnancy to help protect your baby from getting pertussis (whooping cough)  27-36 weeks is the BEST time to do this so that you can pass the protection on to your baby. During pregnancy is better than after pregnancy, but if you are unable to get it during pregnancy it will be offered at the hospital.   You can get this vaccine with [place], at the health department, your family doctor, or some local pharmacies  Everyone who will be around your baby should also be up-to-date on their vaccines before the baby comes. Adults (who are not pregnant) only need 1 dose of Tdap during adulthood.   Tips to Help You Sleep Better:   Get into a bedtime routine, try to do the same thing every night before going to bed to try to help your body wind down  Warm baths  Avoid  caffeine for at least 3 hours before going to sleep   Keep your room at a slightly cooler temperature, can try running a fan  Turn off TV, lights, phone, electronics  Lots of pillows if needed to help you get comfortable  Lavender scented items can help you sleep. You can place lavender essential oil on a cotton ball and place under your pillowcase, or place in a diffuser. [employer] has a lavender scented sleep line (plug-ins, sprays, etc). Look in the pillow aisle for lavender scented pillows.   If none of the above things help, you can try 1/2 to 1 tablet of benadryl, unisom, or tylenol pm. Do not take this every night, only when you really need it.     [place] Pediatricians/Family Doctors:  [facility] [phone]            [facility] [phone]                 [facility] [phone] (usually not accepting new patients unless you have family there already, you are always welcome to call and ask)       [facility] Department [phone]       [facility] Pediatricians/Family Doctors:   [facility]: [phone]  [facility]: [phone]  [facility]: [phone]  [facility]  Doctors:   [facility]: [phone]   [facility]: [phone]  [facility] Doctors:  [facility]: [phone]   Home Blood Pressure Monitoring for Patients   Your provider has recommended that you check your blood pressure (BP) at least once a week at home. If you do not have a blood pressure cuff at home, one will be provided for you. Contact your provider if you have not received your monitor within 1 week.   Helpful Tips for Accurate Home Blood Pressure Checks  . Don't smoke, exercise, or drink caffeine 30 minutes before checking your BP . Use the restroom before checking your BP (a full bladder can raise your pressure) . Relax in a  comfortable upright chair . Feet on the ground . Left arm resting comfortably on a flat surface at the level of your heart . Legs uncrossed . Back supported . Sit quietly and don't talk . Place the cuff on your bare arm . Adjust snuggly, so that only two fingertips can fit between your skin and the top of the cuff . Check 2 readings separated by at least one minute . Keep a log of your BP readings . For a visual, please reference this diagram: http://ccnc.care/bpdiagram  Provider Name: [facility]     Phone: [phone]  Zone 1: ALL CLEAR  Continue to monitor your symptoms:  . BP reading is less than 140 (top number) or less than 90 (bottom number)  . No right upper stomach pain . No headaches or seeing spots . No feeling nauseated or throwing up . No swelling in face and hands  Zone 2: CAUTION Call your doctor's office for any of the following:  . BP reading is greater than 140 (top number) or greater than 90 (bottom number)  . Stomach pain under your ribs in the middle or right side . Headaches or seeing spots . Feeling nauseated or throwing up . Swelling in face and hands  Zone 3: EMERGENCY  Seek immediate medical care if you have any of the following:  . BP reading is greater than160 (top number) or greater than 110 (bottom number) . Severe headaches not improving with Tylenol . Serious difficulty catching your breath . Any worsening symptoms from Zone 2   Third Trimester of Pregnancy The third trimester is from week 29 through week 42, months 7 through 9. The third trimester is a time when the fetus is growing rapidly. At the end of the ninth month, the fetus is about 20 inches in length and weighs 6-10 pounds.  BODY CHANGES Your body goes through many changes during pregnancy. The changes vary from woman to woman.   Your weight will continue to increase. You can expect to gain 25-35 pounds (11-16 kg) by the end of the pregnancy.  You may begin to get stretch  marks on your hips, abdomen, and breasts.  You may urinate more often because the fetus is moving lower into your pelvis and pressing on your bladder.  You may develop or continue to have heartburn as a result of your pregnancy.  You may develop constipation because certain hormones are causing the muscles that push waste through your intestines to slow down.  You may develop hemorrhoids or swollen, bulging veins (varicose veins).  You may have pelvic pain because of the weight gain and pregnancy hormones relaxing your joints between the bones in your pelvis. Backaches may result from overexertion of the muscles supporting your posture.  You may have changes in your hair. These can include thickening of your hair, rapid growth, and changes in texture. Some women also have hair loss during or after pregnancy, or hair that feels dry or thin. Your hair will most likely return to normal after your baby is born.  Your breasts will continue to grow and be tender. A yellow discharge may leak from your breasts called colostrum.  Your belly button may stick out.  You may feel short of breath because of your expanding uterus.  You may notice the fetus "dropping," or moving lower in your abdomen.  You may have a bloody mucus discharge. This usually occurs a few days to a week before labor begins.  Your cervix becomes thin and soft (effaced) near your due date. WHAT TO EXPECT AT YOUR PRENATAL EXAMS  You will have prenatal exams every 2 weeks until week 36. Then, you will have weekly prenatal exams. During a routine prenatal visit:  You will be weighed to make sure you and the fetus are growing normally.  Your blood pressure is taken.  Your abdomen will be measured to track your baby's growth.  The fetal heartbeat will be listened to.  Any test results from the previous visit will be discussed.  You may have a cervical check near your due date to see if you have effaced. At around 36 weeks,  your caregiver will check your cervix. At the same time, your caregiver will also perform a test on the secretions of the vaginal tissue. This test is to determine if a type of bacteria, Group B streptococcus, is present. Your caregiver will explain this further. Your caregiver may ask you:  What your birth plan is.  How you are feeling.  If you are feeling the baby move.  If you have had any abnormal symptoms, such as leaking fluid, bleeding, severe headaches, or abdominal cramping.  If you have any questions. Other tests or screenings that may be performed during your third trimester include:  Blood tests that check for low iron levels (anemia).  Fetal testing to check the health, activity level, and growth of the fetus. Testing is done if you have certain medical conditions or if there are problems during the pregnancy. FALSE LABOR You may feel small, irregular contractions that eventually go away. These are called Braxton Hicks contractions, or false labor. Contractions may last for hours, days, or even weeks before true labor sets in. If contractions come at regular intervals, intensify, or become painful, it is best to be seen by your caregiver.  SIGNS OF LABOR   Menstrual-like cramps.  Contractions that are 5 minutes apart or less.  Contractions that start on the top of the uterus and spread down to the lower abdomen and back.  A sense of increased pelvic pressure or back pain.  A watery or bloody mucus discharge that comes from the vagina. If you have any of these signs before the 37th week of pregnancy, call your caregiver right away. You need to go to the hospital to get checked immediately. HOME CARE INSTRUCTIONS   Avoid all smoking, herbs, alcohol, and unprescribed drugs. These chemicals affect the formation and growth of the baby.  Follow your caregiver's instructions regarding medicine use. There are medicines that are either safe or unsafe to take during  pregnancy.  Exercise only as directed by your caregiver. Experiencing uterine cramps is a good sign to stop exercising.  Continue to eat regular, healthy meals.  Wear a good  support bra for breast tenderness.  Do not use hot tubs, steam rooms, or saunas.  Wear your seat belt at all times when driving.  Avoid raw meat, uncooked cheese, cat litter boxes, and soil used by cats. These carry germs that can cause birth defects in the baby.  Take your prenatal vitamins.  Try taking a stool softener (if your caregiver approves) if you develop constipation. Eat more high-fiber foods, such as fresh vegetables or fruit and whole grains. Drink plenty of fluids to keep your urine clear or pale yellow.  Take warm sitz baths to soothe any pain or discomfort caused by hemorrhoids. Use hemorrhoid cream if your caregiver approves.  If you develop varicose veins, wear support hose. Elevate your feet for 15 minutes, 3-4 times a day. Limit salt in your diet.  Avoid heavy lifting, wear low heal shoes, and practice good posture.  Rest a lot with your legs elevated if you have leg cramps or low back pain.  Visit your dentist if you have not gone during your pregnancy. Use a soft toothbrush to brush your teeth and be gentle when you floss.  A sexual relationship may be continued unless your caregiver directs you otherwise.  Do not travel far distances unless it is absolutely necessary and only with the approval of your caregiver.  Take prenatal classes to understand, practice, and ask questions about the labor and delivery.  Make a trial run to the hospital.  Pack your hospital bag.  Prepare the baby's nursery.  Continue to go to all your prenatal visits as directed by your caregiver. SEEK MEDICAL CARE IF:  You are unsure if you are in labor or if your water has broken.  You have dizziness.  You have mild pelvic cramps, pelvic pressure, or nagging pain in your abdominal area.  You have  persistent nausea, vomiting, or diarrhea.  You have a bad smelling vaginal discharge.  You have pain with urination. SEEK IMMEDIATE MEDICAL CARE IF:   You have a fever.  You are leaking fluid from your vagina.  You have spotting or bleeding from your vagina.  You have severe abdominal cramping or pain.  You have rapid weight loss or gain.  You have shortness of breath with chest pain.  You notice sudden or extreme swelling of your face, hands, ankles, feet, or legs.  You have not felt your baby move in over an hour.  You have severe headaches that do not go away with medicine.  You have vision changes. Document Released: 07/31/2001 Document Revised: 08/11/2013 Document Reviewed: 10/07/2012 [facility] Patient Information 2015 [place], [place]. This information is not intended to replace advice given to you by your health care provider. Make sure you discuss any questions you have with your health care provider.

## 2020-03-01 LAB — CBC
Hematocrit: 34.9 % (ref 34.0–46.6)
Hemoglobin: 11.9 g/dL (ref 11.1–15.9)
MCH: 31.4 pg (ref 26.6–33.0)
MCHC: 34.1 g/dL (ref 31.5–35.7)
MCV: 92 fL (ref 79–97)
Platelets: 205 10*3/uL (ref 150–450)
RBC: 3.79 x10E6/uL (ref 3.77–5.28)
RDW: 13 % (ref 11.7–15.4)
WBC: 12.9 10*3/uL — ABNORMAL HIGH (ref 3.4–10.8)

## 2020-03-01 LAB — RPR: RPR Ser Ql: NONREACTIVE

## 2020-03-01 LAB — GLUCOSE TOLERANCE, 2 HOURS W/ 1HR
Glucose, 1 hour: 127 mg/dL (ref 65–179)
Glucose, 2 hour: 84 mg/dL (ref 65–152)
Glucose, Fasting: 76 mg/dL (ref 65–91)

## 2020-03-01 LAB — ANTIBODY SCREEN: Antibody Screen: NEGATIVE

## 2020-03-01 LAB — HIV ANTIBODY (ROUTINE TESTING W REFLEX): HIV Screen 4th Generation wRfx: NONREACTIVE

## 2020-03-28 ENCOUNTER — Encounter: Payer: BC Managed Care – PPO | Admitting: Advanced Practice Midwife

## 2020-03-30 ENCOUNTER — Ambulatory Visit (INDEPENDENT_AMBULATORY_CARE_PROVIDER_SITE_OTHER): Payer: BC Managed Care – PPO | Admitting: Advanced Practice Midwife

## 2020-03-30 ENCOUNTER — Encounter: Payer: Self-pay | Admitting: Advanced Practice Midwife

## 2020-03-30 VITALS — BP 96/61 | HR 96

## 2020-03-30 DIAGNOSIS — Z3403 Encounter for supervision of normal first pregnancy, third trimester: Secondary | ICD-10-CM | POA: Diagnosis not present

## 2020-03-30 DIAGNOSIS — Z1389 Encounter for screening for other disorder: Secondary | ICD-10-CM

## 2020-03-30 DIAGNOSIS — Z331 Pregnant state, incidental: Secondary | ICD-10-CM | POA: Diagnosis not present

## 2020-03-30 DIAGNOSIS — O99891 Other specified diseases and conditions complicating pregnancy: Secondary | ICD-10-CM

## 2020-03-30 DIAGNOSIS — Z6791 Unspecified blood type, Rh negative: Secondary | ICD-10-CM

## 2020-03-30 DIAGNOSIS — R8271 Bacteriuria: Secondary | ICD-10-CM

## 2020-03-30 DIAGNOSIS — Z3A32 32 weeks gestation of pregnancy: Secondary | ICD-10-CM

## 2020-03-30 DIAGNOSIS — O26899 Other specified pregnancy related conditions, unspecified trimester: Secondary | ICD-10-CM

## 2020-03-30 LAB — POCT URINALYSIS DIPSTICK OB
Blood, UA: NEGATIVE
Glucose, UA: NEGATIVE
Ketones, UA: NEGATIVE
Leukocytes, UA: NEGATIVE
Nitrite, UA: NEGATIVE

## 2020-03-30 NOTE — Patient Instructions (Signed)
Wendy Allen, I greatly value your feedback.  If you receive a survey following your visit with [place] today, we appreciate you taking the time to fill it out.  Thanks, Philipp Deputy CNM   [facility] ([address] [place], [place] [address]) Entrance C, located off of [address] valet parking  Go to [email or web address] to register for FREE online childbirth classes   Call the office [phone]) or go to [facility] if:  You begin to have strong, frequent contractions  Your water breaks.  Sometimes it is a big gush of fluid, sometimes it is just a trickle that keeps getting your panties wet or running down your legs  You have vaginal bleeding.  It is normal to have a small amount of spotting if your cervix was checked.   You don't feel your baby moving like normal.  If you don't, get you something to eat and drink and lay down and focus on feeling your baby move.  You should feel at least 10 movements in 2 hours.  If you don't, you should call the office or go to [facility].    Tdap Vaccine  It is recommended that you get the Tdap vaccine during the third trimester of EACH pregnancy to help protect your baby from getting pertussis (whooping cough)  27-36 weeks is the BEST time to do this so that you can pass the protection on to your baby. During pregnancy is better than after pregnancy, but if you are unable to get it during pregnancy it will be offered at the hospital.   You can get this vaccine with [place], at the health department, your family doctor, or some local pharmacies  Everyone who will be around your baby should also be up-to-date on their vaccines before the baby comes. Adults (who are not pregnant) only need 1 dose of Tdap during adulthood.    Pediatricians/Family Doctors:  [facility] [phone]            [facility] [phone]                 [facility] [phone]  (usually not accepting new patients unless you have family there already, you are always welcome to call and ask)       [facility] [phone]       [facility] Pediatricians/Family Doctors:   [facility]: [phone]  [facility]: [phone]  [facility]: [phone]  [facility] Doctors:   [facility]: [phone]   [facility]: [phone]  [facility] Doctors:  [facility]: [phone]   Home Blood Pressure Monitoring for Patients   Your provider has recommended that you check your blood pressure (BP) at least once a week at home. If you do not have a blood pressure cuff at home, one will be provided for you. Contact your provider if you have not received your monitor within 1 week.   Helpful Tips for Accurate Home Blood Pressure Checks  . Don't smoke, exercise, or drink caffeine 30 minutes before checking your BP . Use the restroom before checking your BP (a full bladder can raise your pressure) . Relax in a comfortable upright chair . Feet on the ground . Left arm resting comfortably on a flat surface at the level of your heart . Legs uncrossed . Back supported . Sit quietly and don't talk . Place the cuff on your bare  arm . Adjust snuggly, so that only two fingertips can fit between your skin and the top of the cuff . Check 2 readings separated by at least one minute . Keep a log of your BP readings . For a visual, please reference this diagram: http://ccnc.care/bpdiagram  Provider Name: [facility]     Phone: [phone]  Zone 1: ALL CLEAR  Continue to monitor your symptoms:  . BP reading is less than 140 (top number) or less than 90 (bottom number)  . No right upper stomach pain . No headaches or seeing spots . No feeling nauseated or throwing up . No swelling in face and hands  Zone 2: CAUTION Call your doctor's office for  any of the following:  . BP reading is greater than 140 (top number) or greater than 90 (bottom number)  . Stomach pain under your ribs in the middle or right side . Headaches or seeing spots . Feeling nauseated or throwing up . Swelling in face and hands  Zone 3: EMERGENCY  Seek immediate medical care if you have any of the following:  . BP reading is greater than160 (top number) or greater than 110 (bottom number) . Severe headaches not improving with Tylenol . Serious difficulty catching your breath . Any worsening symptoms from Zone 2   Third Trimester of Pregnancy The third trimester is from week 29 through week 42, months 7 through 9. The third trimester is a time when the fetus is growing rapidly. At the end of the ninth month, the fetus is about 20 inches in length and weighs 6-10 pounds.  BODY CHANGES Your body goes through many changes during pregnancy. The changes vary from woman to woman.   Your weight will continue to increase. You can expect to gain 25-35 pounds (11-16 kg) by the end of the pregnancy.  You may begin to get stretch marks on your hips, abdomen, and breasts.  You may urinate more often because the fetus is moving lower into your pelvis and pressing on your bladder.  You may develop or continue to have heartburn as a result of your pregnancy.  You may develop constipation because certain hormones are causing the muscles that push waste through your intestines to slow down.  You may develop hemorrhoids or swollen, bulging veins (varicose veins).  You may have pelvic pain because of the weight gain and pregnancy hormones relaxing your joints between the bones in your pelvis. Backaches may result from overexertion of the muscles supporting your posture.  You may have changes in your hair. These can include thickening of your hair, rapid growth, and changes in texture. Some women also have hair loss during or after pregnancy, or hair that feels dry or thin.  Your hair will most likely return to normal after your baby is born.  Your breasts will continue to grow and be tender. A yellow discharge may leak from your breasts called colostrum.  Your belly button may stick out.  You may feel short of breath because of your expanding uterus.  You may notice the fetus "dropping," or moving lower in your abdomen.  You may have a bloody mucus discharge. This usually occurs a few days to a week before labor begins.  Your cervix becomes thin and soft (effaced) near your due date. WHAT TO EXPECT AT YOUR PRENATAL EXAMS  You will have prenatal exams every 2 weeks until week 36. Then, you will have weekly prenatal exams. During a routine prenatal visit:  You  will be weighed to make sure you and the fetus are growing normally.  Your blood pressure is taken.  Your abdomen will be measured to track your baby's growth.  The fetal heartbeat will be listened to.  Any test results from the previous visit will be discussed.  You may have a cervical check near your due date to see if you have effaced. At around 36 weeks, your caregiver will check your cervix. At the same time, your caregiver will also perform a test on the secretions of the vaginal tissue. This test is to determine if a type of bacteria, Group B streptococcus, is present. Your caregiver will explain this further. Your caregiver may ask you:  What your birth plan is.  How you are feeling.  If you are feeling the baby move.  If you have had any abnormal symptoms, such as leaking fluid, bleeding, severe headaches, or abdominal cramping.  If you have any questions. Other tests or screenings that may be performed during your third trimester include:  Blood tests that check for low iron levels (anemia).  Fetal testing to check the health, activity level, and growth of the fetus. Testing is done if you have certain medical conditions or if there are problems during the pregnancy. FALSE  LABOR You may feel small, irregular contractions that eventually go away. These are called Braxton Hicks contractions, or false labor. Contractions may last for hours, days, or even weeks before true labor sets in. If contractions come at regular intervals, intensify, or become painful, it is best to be seen by your caregiver.  SIGNS OF LABOR   Menstrual-like cramps.  Contractions that are 5 minutes apart or less.  Contractions that start on the top of the uterus and spread down to the lower abdomen and back.  A sense of increased pelvic pressure or back pain.  A watery or bloody mucus discharge that comes from the vagina. If you have any of these signs before the 37th week of pregnancy, call your caregiver right away. You need to go to the hospital to get checked immediately. HOME CARE INSTRUCTIONS   Avoid all smoking, herbs, alcohol, and unprescribed drugs. These chemicals affect the formation and growth of the baby.  Follow your caregiver's instructions regarding medicine use. There are medicines that are either safe or unsafe to take during pregnancy.  Exercise only as directed by your caregiver. Experiencing uterine cramps is a good sign to stop exercising.  Continue to eat regular, healthy meals.  Wear a good support bra for breast tenderness.  Do not use hot tubs, steam rooms, or saunas.  Wear your seat belt at all times when driving.  Avoid raw meat, uncooked cheese, cat litter boxes, and soil used by cats. These carry germs that can cause birth defects in the baby.  Take your prenatal vitamins.  Try taking a stool softener (if your caregiver approves) if you develop constipation. Eat more high-fiber foods, such as fresh vegetables or fruit and whole grains. Drink plenty of fluids to keep your urine clear or pale yellow.  Take warm sitz baths to soothe any pain or discomfort caused by hemorrhoids. Use hemorrhoid cream if your caregiver approves.  If you develop varicose  veins, wear support hose. Elevate your feet for 15 minutes, 3-4 times a day. Limit salt in your diet.  Avoid heavy lifting, wear low heal shoes, and practice good posture.  Rest a lot with your legs elevated if you have leg cramps or low back  pain.  Visit your dentist if you have not gone during your pregnancy. Use a soft toothbrush to brush your teeth and be gentle when you floss.  A sexual relationship may be continued unless your caregiver directs you otherwise.  Do not travel far distances unless it is absolutely necessary and only with the approval of your caregiver.  Take prenatal classes to understand, practice, and ask questions about the labor and delivery.  Make a trial run to the hospital.  Pack your hospital bag.  Prepare the baby's nursery.  Continue to go to all your prenatal visits as directed by your caregiver. SEEK MEDICAL CARE IF:  You are unsure if you are in labor or if your water has broken.  You have dizziness.  You have mild pelvic cramps, pelvic pressure, or nagging pain in your abdominal area.  You have persistent nausea, vomiting, or diarrhea.  You have a bad smelling vaginal discharge.  You have pain with urination. SEEK IMMEDIATE MEDICAL CARE IF:   You have a fever.  You are leaking fluid from your vagina.  You have spotting or bleeding from your vagina.  You have severe abdominal cramping or pain.  You have rapid weight loss or gain.  You have shortness of breath with chest pain.  You notice sudden or extreme swelling of your face, hands, ankles, feet, or legs.  You have not felt your baby move in over an hour.  You have severe headaches that do not go away with medicine.  You have vision changes. Document Released: 07/31/2001 Document Revised: 08/11/2013 Document Reviewed: 10/07/2012 [employer] Patient Information 2015 [place], [place]. This information is not intended to replace advice given to you by your health care provider. Make  sure you discuss any questions you have with your health care provider.

## 2020-03-30 NOTE — Progress Notes (Signed)
   LOW-RISK PREGNANCY VISIT Patient name: Wendy Allen MRN [id number]  Date of birth: 1990/12/13 Chief Complaint:   Routine Prenatal Visit  History of Present Illness:   Wendy Allen is a 29 y.o. G64P0000 female at [redacted]w[redacted]d with an Estimated Date of Delivery: 05/20/20 being seen today for ongoing management of a low-risk pregnancy.  Today she reports some carpal tunnel symptoms (tingling); occ swelling of BLE. Contractions: Not present. Vag. Bleeding: None.  Movement: Present. denies leaking of fluid. Review of Systems:   Pertinent items are noted in HPI Denies abnormal vaginal discharge w/ itching/odor/irritation, headaches, visual changes, shortness of breath, chest pain, abdominal pain, severe nausea/vomiting, or problems with urination or bowel movements unless otherwise stated above. Pertinent History Reviewed:  Reviewed past medical,surgical, social, obstetrical and family history.  Reviewed problem list, medications and allergies. Physical Assessment:   Vitals:   03/30/20 1110  BP: 96/61  Pulse: 96  There is no height or weight on file to calculate BMI.        Physical Examination:   General appearance: Well appearing, and in no distress  Mental status: Alert, oriented to person, place, and time  Skin: Warm & dry  Cardiovascular: Normal heart rate noted  Respiratory: Normal respiratory effort, no distress  Abdomen: Soft, gravid, nontender  Pelvic: Cervical exam deferred         Extremities:    Fetal Status: Fetal Heart Rate (bpm): 128 Fundal Height: 31 cm Movement: Present    Results for orders placed or performed in visit on 03/30/20 (from the past 24 hour(s))  POC Urinalysis Dipstick OB   Collection Time: 03/30/20 11:10 AM  Result Value Ref Range   Color, UA     Clarity, UA     Glucose, UA Negative Negative   Bilirubin, UA     Ketones, UA neg    Spec Grav, UA     Blood, UA neg    pH, UA     POC,PROTEIN,UA Trace Negative, Trace, Small (1+), Moderate (2+),  Large (3+), 4+   Urobilinogen, UA     Nitrite, UA neg    Leukocytes, UA Negative Negative   Appearance     Odor      Assessment & Plan:  1) Low-risk pregnancy G1P0000 at [redacted]w[redacted]d with an Estimated Date of Delivery: 05/20/20   2) Rh neg, received Rhogam today  3) Carpal tunnel symptoms, may use wrist support prn pain   Meds: No orders of the defined types were placed in this encounter.  Labs/procedures today: Rhogam  Plan:  Continue routine obstetrical care   Reviewed: Preterm labor symptoms and general obstetric precautions including but not limited to vaginal bleeding, contractions, leaking of fluid and fetal movement were reviewed in detail with the patient.  All questions were answered. Has home bp cuff. Check bp weekly, let us know if >140/90.   Follow-up: Return in about 2 weeks (around 04/13/2020) for LROB, in person.  Orders Placed This Encounter  Procedures  . RHO (D) Immune Globulin  . POC Urinalysis Dipstick OB   Arabella Merles [facility] 03/30/2020 11:48 AM

## 2020-04-12 ENCOUNTER — Encounter: Payer: Self-pay | Admitting: Women's Health

## 2020-04-12 ENCOUNTER — Ambulatory Visit (INDEPENDENT_AMBULATORY_CARE_PROVIDER_SITE_OTHER): Payer: BC Managed Care – PPO | Admitting: Women's Health

## 2020-04-12 VITALS — BP 108/73 | HR 94 | Wt 192.0 lb

## 2020-04-12 DIAGNOSIS — Z1389 Encounter for screening for other disorder: Secondary | ICD-10-CM

## 2020-04-12 DIAGNOSIS — Z3403 Encounter for supervision of normal first pregnancy, third trimester: Secondary | ICD-10-CM

## 2020-04-12 DIAGNOSIS — Z331 Pregnant state, incidental: Secondary | ICD-10-CM

## 2020-04-12 LAB — POCT URINALYSIS DIPSTICK OB
Blood, UA: NEGATIVE
Glucose, UA: NEGATIVE
Ketones, UA: NEGATIVE
Leukocytes, UA: NEGATIVE
Nitrite, UA: NEGATIVE
POC,PROTEIN,UA: NEGATIVE

## 2020-04-12 MED ORDER — PNV PRENATAL PLUS MULTIVITAMIN 27-1 MG PO TABS: 1.0000 | ORAL_TABLET | Freq: Every day | ORAL | 3 refills | Status: AC

## 2020-04-12 NOTE — Progress Notes (Signed)
LOW-RISK PREGNANCY VISIT Patient name: Wendy Allen MRN [id number]  Date of birth: Dec 28, 1990 Chief Complaint:   Routine Prenatal Visit  History of Present Illness:   Wendy Allen is a 29 y.o. G53P0000 female at [redacted]w[redacted]d with an Estimated Date of Delivery: 05/20/20 being seen today for ongoing management of a low-risk pregnancy.  Depression screen [facility] 2/9 02/29/2020 11/16/2019 10/07/2017 06/12/2016  Decreased Interest 0 0 0 0  Down, Depressed, Hopeless 0 0 0 0  PHQ - 2 Score 0 0 0 0  Altered sleeping 0 2 - -  Tired, decreased energy 2 2 - -  Change in appetite 0 1 - -  Feeling bad or failure about yourself  0 0 - -  Trouble concentrating 0 0 - -  Moving slowly or fidgety/restless 0 0 - -  Suicidal thoughts 0 0 - -  PHQ-9 Score 2 5 - -  Difficult doing work/chores - Not difficult at all - -    Today she reports lower back pain/pelvic pressure. Contractions: Not present. Vag. Bleeding: None.  Movement: Present. denies leaking of fluid. Review of Systems:   Pertinent items are noted in HPI Denies abnormal vaginal discharge w/ itching/odor/irritation, headaches, visual changes, shortness of breath, chest pain, abdominal pain, severe nausea/vomiting, or problems with urination or bowel movements unless otherwise stated above. Pertinent History Reviewed:  Reviewed past medical,surgical, social, obstetrical and family history.  Reviewed problem list, medications and allergies. Physical Assessment:   Vitals:   04/12/20 0908  BP: 108/73  Pulse: 94  Weight: 192 lb (87.1 kg)  Body mass index is 29.19 kg/m.        Physical Examination:   General appearance: Well appearing, and in no distress  Mental status: Alert, oriented to person, place, and time  Skin: Warm & dry  Cardiovascular: Normal heart rate noted  Respiratory: Normal respiratory effort, no distress  Abdomen: Soft, gravid, nontender  Pelvic: Cervical exam deferred         Extremities: Edema: Trace  Fetal Status:  Fetal Heart Rate (bpm): 147 Fundal Height: 34 cm Movement: Present    Chaperone: n/a    Results for orders placed or performed in visit on 04/12/20 (from the past 24 hour(s))  POC Urinalysis Dipstick OB   Collection Time: 04/12/20  9:12 AM  Result Value Ref Range   Color, UA     Clarity, UA     Glucose, UA Negative Negative   Bilirubin, UA     Ketones, UA neg    Spec Grav, UA     Blood, UA neg    pH, UA     POC,PROTEIN,UA Negative Negative, Trace, Small (1+), Moderate (2+), Large (3+), 4+   Urobilinogen, UA     Nitrite, UA neg    Leukocytes, UA Negative Negative   Appearance     Odor      Assessment & Plan:  1) Low-risk pregnancy G1P0000 at [redacted]w[redacted]d with an Estimated Date of Delivery: 05/20/20   2) Lower back pain/pelvic pressure, discussed belt/tape   Meds:  Meds ordered this encounter  Medications  . Prenatal Vit-Fe Fumarate-FA (PNV PRENATAL PLUS MULTIVITAMIN) 27-1 MG TABS    Sig: Take 1 tablet by mouth daily.    Dispense:  90 tablet    Refill:  3    Order Specific Question:   Supervising Provider    Answer:   Lazaro Arms [[id number]]   Labs/procedures today: none  Plan:  Continue routine obstetrical care  Next visit:  prefers will be in person for gbs    Reviewed: Preterm labor symptoms and general obstetric precautions including but not limited to vaginal bleeding, contractions, leaking of fluid and fetal movement were reviewed in detail with the patient.  All questions were answered.   Follow-up: Return in about 2 weeks (around 04/26/2020) for LROB, in person, CNM.  Orders Placed This Encounter  Procedures  . POC Urinalysis Dipstick OB   Cheral Marker CNM, [facility] 04/12/2020 9:21 AM

## 2020-04-12 NOTE — Patient Instructions (Signed)
Wendy Allen, I greatly value your feedback.  If you receive a survey following your visit with [place] today, we appreciate you taking the time to fill it out.  Thanks, Wendy Allen, CNM, WHNP-BC  [facility] ([address] [place], [place] [address]) Entrance C, located off of [address] valet parking   Go to [email or web address] to register for FREE online childbirth classes    Call the office [phone]) or go to [facility] if:  You begin to have strong, frequent contractions  Your water breaks.  Sometimes it is a big gush of fluid, sometimes it is just a trickle that keeps getting your panties wet or running down your legs  You have vaginal bleeding.  It is normal to have a small amount of spotting if your cervix was checked.   You don't feel your baby moving like normal.  If you don't, get you something to eat and drink and lay down and focus on feeling your baby move.  You should feel at least 10 movements in 2 hours.  If you don't, you should call the office or go to [facility].   Call the office [phone]) or go to [facility] hospital for these signs of pre-eclampsia:  Severe headache that does not go away with Tylenol  Visual changes- seeing spots, double, blurred vision  Pain under your right breast or upper abdomen that does not go away with Tums or heartburn medicine  Nausea and/or vomiting  Severe swelling in your hands, feet, and face    Home Blood Pressure Monitoring for Patients   Your provider has recommended that you check your blood pressure (BP) at least once a week at home. If you do not have a blood pressure cuff at home, one will be provided for you. Contact your provider if you have not received your monitor within 1 week.   Helpful Tips for Accurate Home Blood Pressure Checks  . Don't smoke, exercise, or drink caffeine 30 minutes before checking your BP . Use the restroom before checking your BP (a full  bladder can raise your pressure) . Relax in a comfortable upright chair . Feet on the ground . Left arm resting comfortably on a flat surface at the level of your heart . Legs uncrossed . Back supported . Sit quietly and don't talk . Place the cuff on your bare arm . Adjust snuggly, so that only two fingertips can fit between your skin and the top of the cuff . Check 2 readings separated by at least one minute . Keep a log of your BP readings . For a visual, please reference this diagram: http://ccnc.care/bpdiagram  Provider Name: [facility]     Phone: [phone]  Zone 1: ALL CLEAR  Continue to monitor your symptoms:  . BP reading is less than 140 (top number) or less than 90 (bottom number)  . No right upper stomach pain . No headaches or seeing spots . No feeling nauseated or throwing up . No swelling in face and hands  Zone 2: CAUTION Call your doctor's office for any of the following:  . BP reading is greater than 140 (top number) or greater than 90 (bottom number)  . Stomach pain under your ribs in the middle or right side . Headaches or seeing spots . Feeling nauseated or throwing up . Swelling in face and hands  Zone 3: EMERGENCY  Seek immediate medical care if you have any of  the following:  . BP reading is greater than160 (top number) or greater than 110 (bottom number) . Severe headaches not improving with Tylenol . Serious difficulty catching your breath . Any worsening symptoms from Zone 2  Preterm Labor and Birth Information  The normal length of a pregnancy is 39-41 weeks. Preterm labor is when labor starts before 37 completed weeks of pregnancy. What are the risk factors for preterm labor? Preterm labor is more likely to occur in women who:  Have certain infections during pregnancy such as a bladder infection, sexually transmitted infection, or infection inside the uterus (chorioamnionitis).  Have a shorter-than-normal cervix.  Have gone into  preterm labor before.  Have had surgery on their cervix.  Are younger than age 54 or older than age 25.  Are African American.  Are pregnant with twins or multiple babies (multiple gestation).  Take street drugs or smoke while pregnant.  Do not gain enough weight while pregnant.  Became pregnant shortly after having been pregnant. What are the symptoms of preterm labor? Symptoms of preterm labor include:  Cramps similar to those that can happen during a menstrual period. The cramps may happen with diarrhea.  Pain in the abdomen or lower back.  Regular uterine contractions that may feel like tightening of the abdomen.  A feeling of increased pressure in the pelvis.  Increased watery or bloody mucus discharge from the vagina.  Water breaking (ruptured amniotic sac). Why is it important to recognize signs of preterm labor? It is important to recognize signs of preterm labor because babies who are born prematurely may not be fully developed. This can put them at an increased risk for:  Long-term (chronic) heart and lung problems.  Difficulty immediately after birth with regulating body systems, including blood sugar, body temperature, heart rate, and breathing rate.  Bleeding in the brain.  Cerebral palsy.  Learning difficulties.  Death. These risks are highest for babies who are born before 48 weeks of pregnancy. How is preterm labor treated? Treatment depends on the length of your pregnancy, your condition, and the health of your baby. It may involve: 1. Having a stitch (suture) placed in your cervix to prevent your cervix from opening too early (cerclage). 2. Taking or being given medicines, such as: ? Hormone medicines. These may be given early in pregnancy to help support the pregnancy. ? Medicine to stop contractions. ? Medicines to help mature the baby's lungs. These may be prescribed if the risk of delivery is high. ? Medicines to prevent your baby from  developing cerebral palsy. If the labor happens before 34 weeks of pregnancy, you may need to stay in the hospital. What should I do if I think I am in preterm labor? If you think that you are going into preterm labor, call your health care provider right away. How can I prevent preterm labor in future pregnancies? To increase your chance of having a full-term pregnancy:  Do not use any tobacco products, such as cigarettes, chewing tobacco, and e-cigarettes. If you need help quitting, ask your health care provider.  Do not use street drugs or medicines that have not been prescribed to you during your pregnancy.  Talk with your health care provider before taking any herbal supplements, even if you have been taking them regularly.  Make sure you gain a healthy amount of weight during your pregnancy.  Watch for infection. If you think that you might have an infection, get it checked right away.  Make sure to  tell your health care provider if you have gone into preterm labor before. This information is not intended to replace advice given to you by your health care provider. Make sure you discuss any questions you have with your health care provider. Document Revised: 11/28/2018 Document Reviewed: 12/28/2015 Elsevier Patient Education  [place].

## 2020-04-26 ENCOUNTER — Encounter: Payer: Self-pay | Admitting: Women's Health

## 2020-04-26 ENCOUNTER — Other Ambulatory Visit (HOSPITAL_COMMUNITY)
Admission: RE | Admit: 2020-04-26 | Discharge: 2020-04-26 | Disposition: A | Discharge status: Home / Self Care | Payer: BC Managed Care – PPO | Source: Ambulatory Visit | Attending: Obstetrics & Gynecology | Admitting: Obstetrics & Gynecology

## 2020-04-26 ENCOUNTER — Other Ambulatory Visit: Payer: Self-pay

## 2020-04-26 ENCOUNTER — Ambulatory Visit (INDEPENDENT_AMBULATORY_CARE_PROVIDER_SITE_OTHER): Payer: BC Managed Care – PPO | Admitting: Women's Health

## 2020-04-26 VITALS — BP 113/77 | HR 107 | Wt 195.6 lb

## 2020-04-26 DIAGNOSIS — Z3403 Encounter for supervision of normal first pregnancy, third trimester: Secondary | ICD-10-CM | POA: Diagnosis present

## 2020-04-26 DIAGNOSIS — Z331 Pregnant state, incidental: Secondary | ICD-10-CM

## 2020-04-26 DIAGNOSIS — Z1389 Encounter for screening for other disorder: Secondary | ICD-10-CM

## 2020-04-26 DIAGNOSIS — Z3A36 36 weeks gestation of pregnancy: Secondary | ICD-10-CM

## 2020-04-26 LAB — POCT URINALYSIS DIPSTICK OB
Blood, UA: NEGATIVE
Glucose, UA: NEGATIVE
Ketones, UA: NEGATIVE
Leukocytes, UA: NEGATIVE
Nitrite, UA: NEGATIVE
POC,PROTEIN,UA: NEGATIVE

## 2020-04-26 NOTE — Progress Notes (Signed)
LOW-RISK PREGNANCY VISIT Patient name: Wendy Allen MRN [id number]  Date of birth: 1990-12-11 Chief Complaint:   Routine Prenatal Visit (gbs-gc-chl)  History of Present Illness:   Wendy Allen is a 29 y.o. G65P0000 female at [redacted]w[redacted]d with an Estimated Date of Delivery: 05/20/20 being seen today for ongoing management of a low-risk pregnancy.  Depression screen [facility] 2/9 02/29/2020 11/16/2019 10/07/2017 06/12/2016  Decreased Interest 0 0 0 0  Down, Depressed, Hopeless 0 0 0 0  PHQ - 2 Score 0 0 0 0  Altered sleeping 0 2 - -  Tired, decreased energy 2 2 - -  Change in appetite 0 1 - -  Feeling bad or failure about yourself  0 0 - -  Trouble concentrating 0 0 - -  Moving slowly or fidgety/restless 0 0 - -  Suicidal thoughts 0 0 - -  PHQ-9 Score 2 5 - -  Difficult doing work/chores - Not difficult at all - -    Today she reports no complaints. Contractions: Not present.  .  Movement: Present. denies leaking of fluid. Review of Systems:   Pertinent items are noted in HPI Denies abnormal vaginal discharge w/ itching/odor/irritation, headaches, visual changes, shortness of breath, chest pain, abdominal pain, severe nausea/vomiting, or problems with urination or bowel movements unless otherwise stated above. Pertinent History Reviewed:  Reviewed past medical,surgical, social, obstetrical and family history.  Reviewed problem list, medications and allergies. Physical Assessment:   Vitals:   04/26/20 0857  BP: 113/77  Pulse: (!) 107  Weight: 195 lb 9.6 oz (88.7 kg)  Body mass index is 29.74 kg/m.        Physical Examination:   General appearance: Well appearing, and in no distress  Mental status: Alert, oriented to person, place, and time  Skin: Warm & dry  Cardiovascular: Normal heart rate noted  Respiratory: Normal respiratory effort, no distress  Abdomen: Soft, gravid, nontender  Pelvic: Cervical exam performed  Dilation: 1 Effacement (%): 50 Station: -2  Extremities:  Edema: None  Fetal Status: Fetal Heart Rate (bpm): 147 Fundal Height: 36 cm Movement: Present Presentation: Vertex  Chaperone: Peggy Dones    Results for orders placed or performed in visit on 04/26/20 (from the past 24 hour(s))  POC Urinalysis Dipstick OB   Collection Time: 04/26/20  9:02 AM  Result Value Ref Range   Color, UA     Clarity, UA     Glucose, UA Negative Negative   Bilirubin, UA     Ketones, UA neg    Spec Grav, UA     Blood, UA neg    pH, UA     POC,PROTEIN,UA Negative Negative, Trace, Small (1+), Moderate (2+), Large (3+), 4+   Urobilinogen, UA     Nitrite, UA neg    Leukocytes, UA Negative Negative   Appearance     Odor      Assessment & Plan:  1) Low-risk pregnancy G1P0000 at [redacted]w[redacted]d with an Estimated Date of Delivery: 05/20/20    Meds: No orders of the defined types were placed in this encounter.  Labs/procedures today: gbs, gc/ct, sve  Plan:  Continue routine obstetrical care  Next visit: prefers in person    Reviewed: Preterm labor symptoms and general obstetric precautions including but not limited to vaginal bleeding, contractions, leaking of fluid and fetal movement were reviewed in detail with the patient.  All questions were answered. Has home bp cuff. Check bp weekly, let us know if >140/90.   Follow-up:  Return in about 1 week (around 05/03/2020) for LROB, CNM, in person.  Orders Placed This Encounter  Procedures  . Strep Gp B NAA  . POC Urinalysis Dipstick OB   Cheral Marker CNM, [facility] 04/26/2020 9:38 AM

## 2020-04-26 NOTE — Patient Instructions (Signed)
Wendy Allen, I greatly value your feedback.  If you receive a survey following your visit with [place] today, we appreciate you taking the time to fill it out.  Thanks, Joellyn Haff, CNM, WHNP-BC  [facility] ([address] [place], [place] [address]) Entrance C, located off of [address] valet parking   Go to [email or web address] to register for FREE online childbirth classes    Call the office [phone]) or go to [facility] if:  You begin to have strong, frequent contractions  Your water breaks.  Sometimes it is a big gush of fluid, sometimes it is just a trickle that keeps getting your panties wet or running down your legs  You have vaginal bleeding.  It is normal to have a small amount of spotting if your cervix was checked.   You don't feel your baby moving like normal.  If you don't, get you something to eat and drink and lay down and focus on feeling your baby move.  You should feel at least 10 movements in 2 hours.  If you don't, you should call the office or go to [facility].   Call the office [phone]) or go to [facility] hospital for these signs of pre-eclampsia:  Severe headache that does not go away with Tylenol  Visual changes- seeing spots, double, blurred vision  Pain under your right breast or upper abdomen that does not go away with Tums or heartburn medicine  Nausea and/or vomiting  Severe swelling in your hands, feet, and face    Home Blood Pressure Monitoring for Patients   Your provider has recommended that you check your blood pressure (BP) at least once a week at home. If you do not have a blood pressure cuff at home, one will be provided for you. Contact your provider if you have not received your monitor within 1 week.   Helpful Tips for Accurate Home Blood Pressure Checks  . Don't smoke, exercise, or drink caffeine 30 minutes before checking your BP . Use the restroom before checking your BP (a full  bladder can raise your pressure) . Relax in a comfortable upright chair . Feet on the ground . Left arm resting comfortably on a flat surface at the level of your heart . Legs uncrossed . Back supported . Sit quietly and don't talk . Place the cuff on your bare arm . Adjust snuggly, so that only two fingertips can fit between your skin and the top of the cuff . Check 2 readings separated by at least one minute . Keep a log of your BP readings . For a visual, please reference this diagram: http://ccnc.care/bpdiagram  Provider Name: [facility]     Phone: [phone]  Zone 1: ALL CLEAR  Continue to monitor your symptoms:  . BP reading is less than 140 (top number) or less than 90 (bottom number)  . No right upper stomach pain . No headaches or seeing spots . No feeling nauseated or throwing up . No swelling in face and hands  Zone 2: CAUTION Call your doctor's office for any of the following:  . BP reading is greater than 140 (top number) or greater than 90 (bottom number)  . Stomach pain under your ribs in the middle or right side . Headaches or seeing spots . Feeling nauseated or throwing up . Swelling in face and hands  Zone 3: EMERGENCY  Seek immediate medical care if you have any of  the following:  . BP reading is greater than160 (top number) or greater than 110 (bottom number) . Severe headaches not improving with Tylenol . Serious difficulty catching your breath . Any worsening symptoms from Zone 2   Braxton Hicks Contractions Contractions of the uterus can occur throughout pregnancy, but they are not always a sign that you are in labor. You may have practice contractions called Braxton Hicks contractions. These false labor contractions are sometimes confused with true labor. What are Braxton Hicks contractions? Braxton Hicks contractions are tightening movements that occur in the muscles of the uterus before labor. Unlike true labor contractions, these  contractions do not result in opening (dilation) and thinning of the cervix. Toward the end of pregnancy (32-34 weeks), Braxton Hicks contractions can happen more often and may become stronger. These contractions are sometimes difficult to tell apart from true labor because they can be very uncomfortable. You should not feel embarrassed if you go to the hospital with false labor. Sometimes, the only way to tell if you are in true labor is for your health care provider to look for changes in the cervix. The health care provider will do a physical exam and may monitor your contractions. If you are not in true labor, the exam should show that your cervix is not dilating and your water has not broken. If there are no other health problems associated with your pregnancy, it is completely safe for you to be sent home with false labor. You may continue to have Braxton Hicks contractions until you go into true labor. How to tell the difference between true labor and false labor True labor  Contractions last 30-70 seconds.  Contractions become very regular.  Discomfort is usually felt in the top of the uterus, and it spreads to the lower abdomen and low back.  Contractions do not go away with walking.  Contractions usually become more intense and increase in frequency.  The cervix dilates and gets thinner. False labor  Contractions are usually shorter and not as strong as true labor contractions.  Contractions are usually irregular.  Contractions are often felt in the front of the lower abdomen and in the groin.  Contractions may go away when you walk around or change positions while lying down.  Contractions get weaker and are shorter-lasting as time goes on.  The cervix usually does not dilate or become thin. Follow these instructions at home:  1. Take over-the-counter and prescription medicines only as told by your health care provider. 2. Keep up with your usual exercises and follow other  instructions from your health care provider. 3. Eat and drink lightly if you think you are going into labor. 4. If Braxton Hicks contractions are making you uncomfortable: ? Change your position from lying down or resting to walking, or change from walking to resting. ? Sit and rest in a tub of warm water. ? Drink enough fluid to keep your urine pale yellow. Dehydration may cause these contractions. ? Do slow and deep breathing several times an hour. 5. Keep all follow-up prenatal visits as told by your health care provider. This is important. Contact a health care provider if:  You have a fever.  You have continuous pain in your abdomen. Get help right away if:  Your contractions become stronger, more regular, and closer together.  You have fluid leaking or gushing from your vagina.  You pass blood-tinged mucus (bloody show).  You have bleeding from your vagina.  You have low back   pain that you never had before.  You feel your baby's head pushing down and causing pelvic pressure.  Your baby is not moving inside you as much as it used to. Summary  Contractions that occur before labor are called Braxton Hicks contractions, false labor, or practice contractions.  Braxton Hicks contractions are usually shorter, weaker, farther apart, and less regular than true labor contractions. True labor contractions usually become progressively stronger and regular, and they become more frequent.  Manage discomfort from [facility] contractions by changing position, resting in a warm bath, drinking plenty of water, or practicing deep breathing. This information is not intended to replace advice given to you by your health care provider. Make sure you discuss any questions you have with your health care provider. Document Revised: 07/19/2017 Document Reviewed: 12/20/2016 Elsevier Patient Education  [place].

## 2020-04-27 LAB — CERVICOVAGINAL ANCILLARY ONLY
Chlamydia: NEGATIVE
Comment: NEGATIVE
Comment: NORMAL
Neisseria Gonorrhea: NEGATIVE

## 2020-04-28 LAB — STREP GP B NAA: Strep Gp B NAA: NEGATIVE

## 2020-05-03 ENCOUNTER — Inpatient Hospital Stay (HOSPITAL_COMMUNITY)
Admission: AD | Admit: 2020-05-03 | Discharge: 2020-05-04 | DRG: 807 | Disposition: A | Discharge status: Home / Self Care | Payer: BC Managed Care – PPO | Attending: Obstetrics & Gynecology | Admitting: Obstetrics & Gynecology

## 2020-05-03 ENCOUNTER — Encounter (HOSPITAL_COMMUNITY): Payer: Self-pay | Admitting: Obstetrics and Gynecology

## 2020-05-03 ENCOUNTER — Inpatient Hospital Stay (HOSPITAL_COMMUNITY): Payer: BC Managed Care – PPO | Admitting: Anesthesiology

## 2020-05-03 ENCOUNTER — Other Ambulatory Visit: Payer: Self-pay

## 2020-05-03 DIAGNOSIS — O26893 Other specified pregnancy related conditions, third trimester: Secondary | ICD-10-CM | POA: Diagnosis present

## 2020-05-03 DIAGNOSIS — O4202 Full-term premature rupture of membranes, onset of labor within 24 hours of rupture: Secondary | ICD-10-CM

## 2020-05-03 DIAGNOSIS — R8271 Bacteriuria: Secondary | ICD-10-CM | POA: Diagnosis present

## 2020-05-03 DIAGNOSIS — O4290 Premature rupture of membranes, unspecified as to length of time between rupture and onset of labor, unspecified weeks of gestation: Secondary | ICD-10-CM | POA: Diagnosis present

## 2020-05-03 DIAGNOSIS — O4292 Full-term premature rupture of membranes, unspecified as to length of time between rupture and onset of labor: Secondary | ICD-10-CM | POA: Diagnosis present

## 2020-05-03 DIAGNOSIS — Z20822 Contact with and (suspected) exposure to covid-19: Secondary | ICD-10-CM | POA: Diagnosis present

## 2020-05-03 DIAGNOSIS — Z3403 Encounter for supervision of normal first pregnancy, third trimester: Secondary | ICD-10-CM

## 2020-05-03 DIAGNOSIS — O30009 Twin pregnancy, unspecified number of placenta and unspecified number of amniotic sacs, unspecified trimester: Secondary | ICD-10-CM | POA: Diagnosis present

## 2020-05-03 DIAGNOSIS — O42013 Preterm premature rupture of membranes, onset of labor within 24 hours of rupture, third trimester: Secondary | ICD-10-CM | POA: Diagnosis not present

## 2020-05-03 DIAGNOSIS — O329XX0 Maternal care for malpresentation of fetus, unspecified, not applicable or unspecified: Secondary | ICD-10-CM | POA: Diagnosis not present

## 2020-05-03 DIAGNOSIS — Z87891 Personal history of nicotine dependence: Secondary | ICD-10-CM | POA: Diagnosis not present

## 2020-05-03 DIAGNOSIS — Z3400 Encounter for supervision of normal first pregnancy, unspecified trimester: Secondary | ICD-10-CM

## 2020-05-03 DIAGNOSIS — Z3A37 37 weeks gestation of pregnancy: Secondary | ICD-10-CM

## 2020-05-03 DIAGNOSIS — Z6791 Unspecified blood type, Rh negative: Secondary | ICD-10-CM | POA: Diagnosis not present

## 2020-05-03 DIAGNOSIS — O26899 Other specified pregnancy related conditions, unspecified trimester: Secondary | ICD-10-CM

## 2020-05-03 DIAGNOSIS — Z79891 Long term (current) use of opiate analgesic: Secondary | ICD-10-CM | POA: Diagnosis not present

## 2020-05-03 LAB — CBC
HCT: 37.1 % (ref 36.0–46.0)
Hemoglobin: 12.4 g/dL (ref 12.0–15.0)
MCH: 31 pg (ref 26.0–34.0)
MCHC: 33.4 g/dL (ref 30.0–36.0)
MCV: 92.8 fL (ref 80.0–100.0)
Platelets: 259 10*3/uL (ref 150–400)
RBC: 4 MIL/uL (ref 3.87–5.11)
RDW: 13.7 % (ref 11.5–15.5)
WBC: 16.8 10*3/uL — ABNORMAL HIGH (ref 4.0–10.5)
nRBC: 0 % (ref 0.0–0.2)

## 2020-05-03 LAB — TYPE AND SCREEN
ABO/RH(D): O NEG
Antibody Screen: POSITIVE

## 2020-05-03 LAB — SARS CORONAVIRUS 2 BY RT PCR (HOSPITAL ORDER, PERFORMED IN ~~LOC~~ HOSPITAL LAB): SARS Coronavirus 2: NEGATIVE

## 2020-05-03 LAB — POCT FERN TEST: POCT Fern Test: POSITIVE

## 2020-05-03 LAB — RPR: RPR Ser Ql: NONREACTIVE

## 2020-05-03 MED ORDER — TETANUS-DIPHTH-ACELL PERTUSSIS 5-2.5-18.5 LF-MCG/0.5 IM SUSP: 0.5000 mL | Freq: Once | INTRAMUSCULAR | Status: DC

## 2020-05-03 MED ORDER — LIDOCAINE HCL (PF) 1 % IJ SOLN
INTRAMUSCULAR | Status: DC | PRN
  Administered 2020-05-03: 2 mL via EPIDURAL
  Administered 2020-05-03: 10 mL via EPIDURAL

## 2020-05-03 MED ORDER — ONDANSETRON HCL 4 MG/2ML IJ SOLN: 4.0000 mg | Freq: Four times a day (QID) | INTRAMUSCULAR | Status: DC | PRN

## 2020-05-03 MED ORDER — FENTANYL CITRATE (PF) 100 MCG/2ML IJ SOLN
50.0000 ug | INTRAMUSCULAR | Status: DC | PRN
  Administered 2020-05-03 (×2): 100 ug via INTRAVENOUS
  Filled 2020-05-03 (×3): qty 2

## 2020-05-03 MED ORDER — EPHEDRINE 5 MG/ML INJ: 10.0000 mg | INTRAVENOUS | Status: DC | PRN

## 2020-05-03 MED ORDER — MISOPROSTOL 50MCG HALF TABLET
ORAL_TABLET | ORAL | Status: AC
  Filled 2020-05-03: qty 1

## 2020-05-03 MED ORDER — LACTATED RINGERS IV SOLN: 500.0000 mL | Freq: Once | INTRAVENOUS | Status: DC

## 2020-05-03 MED ORDER — LACTATED RINGERS IV SOLN: INTRAVENOUS | Status: DC

## 2020-05-03 MED ORDER — ONDANSETRON HCL 4 MG PO TABS: 4.0000 mg | ORAL_TABLET | ORAL | Status: DC | PRN

## 2020-05-03 MED ORDER — PRENATAL MULTIVITAMIN CH
1.0000 | ORAL_TABLET | Freq: Every day | ORAL | Status: DC
  Administered 2020-05-04: 1 via ORAL
  Filled 2020-05-03: qty 1

## 2020-05-03 MED ORDER — OXYTOCIN BOLUS FROM INFUSION
333.0000 mL | Freq: Once | INTRAVENOUS | Status: AC
  Administered 2020-05-03: 333 mL via INTRAVENOUS

## 2020-05-03 MED ORDER — SIMETHICONE 80 MG PO CHEW: 80.0000 mg | CHEWABLE_TABLET | ORAL | Status: DC | PRN

## 2020-05-03 MED ORDER — COCONUT OIL OIL: 1.0000 "application " | TOPICAL_OIL | Status: DC | PRN

## 2020-05-03 MED ORDER — FENTANYL-BUPIVACAINE-NACL 0.5-0.125-0.9 MG/250ML-% EP SOLN
EPIDURAL | Status: AC
  Filled 2020-05-03: qty 250

## 2020-05-03 MED ORDER — BENZOCAINE-MENTHOL 20-0.5 % EX AERO
1.0000 "application " | INHALATION_SPRAY | CUTANEOUS | Status: DC | PRN
  Administered 2020-05-03: 1 via TOPICAL
  Filled 2020-05-03: qty 56

## 2020-05-03 MED ORDER — BUPIVACAINE HCL (PF) 0.75 % IJ SOLN
INTRAMUSCULAR | Status: DC | PRN
Start: 2020-05-03 — End: 2020-05-03
  Administered 2020-05-03: 12 mL/h via EPIDURAL

## 2020-05-03 MED ORDER — SENNOSIDES-DOCUSATE SODIUM 8.6-50 MG PO TABS
2.0000 | ORAL_TABLET | ORAL | Status: DC
  Administered 2020-05-03: 2 via ORAL
  Filled 2020-05-03: qty 2

## 2020-05-03 MED ORDER — DIPHENHYDRAMINE HCL 25 MG PO CAPS: 25.0000 mg | ORAL_CAPSULE | Freq: Four times a day (QID) | ORAL | Status: DC | PRN

## 2020-05-03 MED ORDER — ONDANSETRON HCL 4 MG/2ML IJ SOLN: 4.0000 mg | INTRAMUSCULAR | Status: DC | PRN

## 2020-05-03 MED ORDER — OXYTOCIN-SODIUM CHLORIDE 30-0.9 UT/500ML-% IV SOLN
2.5000 [IU]/h | INTRAVENOUS | Status: DC
  Administered 2020-05-03: 2.5 [IU]/h via INTRAVENOUS
  Filled 2020-05-03: qty 500

## 2020-05-03 MED ORDER — DIPHENHYDRAMINE HCL 50 MG/ML IJ SOLN: 12.5000 mg | INTRAMUSCULAR | Status: DC | PRN

## 2020-05-03 MED ORDER — LACTATED RINGERS IV SOLN: 500.0000 mL | INTRAVENOUS | Status: DC | PRN

## 2020-05-03 MED ORDER — PHENYLEPHRINE 40 MCG/ML (10ML) SYRINGE FOR IV PUSH (FOR BLOOD PRESSURE SUPPORT): 80.0000 ug | PREFILLED_SYRINGE | INTRAVENOUS | Status: DC | PRN

## 2020-05-03 MED ORDER — DIBUCAINE (PERIANAL) 1 % EX OINT: 1.0000 "application " | TOPICAL_OINTMENT | CUTANEOUS | Status: DC | PRN

## 2020-05-03 MED ORDER — TERBUTALINE SULFATE 1 MG/ML IJ SOLN: 0.2500 mg | Freq: Once | INTRAMUSCULAR | Status: DC | PRN

## 2020-05-03 MED ORDER — TERBUTALINE SULFATE 1 MG/ML IJ SOLN
INTRAMUSCULAR | Status: AC
  Administered 2020-05-03: 0.25 mg
  Filled 2020-05-03: qty 1

## 2020-05-03 MED ORDER — ACETAMINOPHEN 325 MG PO TABS
650.0000 mg | ORAL_TABLET | ORAL | Status: DC | PRN
  Administered 2020-05-03 – 2020-05-04 (×2): 650 mg via ORAL
  Filled 2020-05-03 (×2): qty 2

## 2020-05-03 MED ORDER — ZOLPIDEM TARTRATE 5 MG PO TABS: 5.0000 mg | ORAL_TABLET | Freq: Every evening | ORAL | Status: DC | PRN

## 2020-05-03 MED ORDER — PNEUMOCOCCAL VAC POLYVALENT 25 MCG/0.5ML IJ INJ: 0.5000 mL | INJECTION | INTRAMUSCULAR | Status: DC

## 2020-05-03 MED ORDER — PHENYLEPHRINE 40 MCG/ML (10ML) SYRINGE FOR IV PUSH (FOR BLOOD PRESSURE SUPPORT)
PREFILLED_SYRINGE | INTRAVENOUS | Status: AC
  Filled 2020-05-03: qty 10

## 2020-05-03 MED ORDER — FENTANYL-BUPIVACAINE-NACL 0.5-0.125-0.9 MG/250ML-% EP SOLN: 12.0000 mL/h | EPIDURAL | Status: DC | PRN

## 2020-05-03 MED ORDER — LIDOCAINE HCL (PF) 1 % IJ SOLN: 30.0000 mL | INTRAMUSCULAR | Status: DC | PRN

## 2020-05-03 MED ORDER — SOD CITRATE-CITRIC ACID 500-334 MG/5ML PO SOLN: 30.0000 mL | ORAL | Status: DC | PRN

## 2020-05-03 MED ORDER — MISOPROSTOL 50MCG HALF TABLET
50.0000 ug | ORAL_TABLET | ORAL | Status: DC
  Administered 2020-05-03: 50 ug via ORAL

## 2020-05-03 MED ORDER — FENTANYL CITRATE (PF) 100 MCG/2ML IJ SOLN
INTRAMUSCULAR | Status: DC | PRN
Start: 2020-05-03 — End: 2020-05-03
  Administered 2020-05-03: 100 ug via EPIDURAL

## 2020-05-03 MED ORDER — ACETAMINOPHEN 325 MG PO TABS: 650.0000 mg | ORAL_TABLET | ORAL | Status: DC | PRN

## 2020-05-03 MED ORDER — IBUPROFEN 600 MG PO TABS
600.0000 mg | ORAL_TABLET | Freq: Four times a day (QID) | ORAL | Status: DC
  Administered 2020-05-03 – 2020-05-04 (×5): 600 mg via ORAL
  Filled 2020-05-03 (×5): qty 1

## 2020-05-03 MED ORDER — OXYTOCIN-SODIUM CHLORIDE 30-0.9 UT/500ML-% IV SOLN
1.0000 m[IU]/min | INTRAVENOUS | Status: DC
  Administered 2020-05-03: 2 m[IU]/min via INTRAVENOUS

## 2020-05-03 MED ORDER — WITCH HAZEL-GLYCERIN EX PADS: 1.0000 "application " | MEDICATED_PAD | CUTANEOUS | Status: DC | PRN

## 2020-05-03 NOTE — MAU Note (Signed)
.   Wendy Allen is a 29 y.o. at [redacted]w[redacted]d here in MAU reporting: SROM at 0030 clear fluid. Patient states that she has had ctx since then. Also reports some bloody show when she wipes. GBS neg. Endorses good fetal movement.   Pain score: 0 Vitals:   05/03/20 0239  BP: 132/83  Pulse: (!) 102  Resp: 15  Temp: (!) 97.5 F (36.4 C)  SpO2: 100%     FHT:129

## 2020-05-03 NOTE — Lactation Note (Signed)
This note was copied from a baby's chart. Lactation Consultation Note  Patient Name: Wendy Allen [id number] Date: 05/03/2020 Reason for consult: Initial assessment;1st time breastfeeding;Difficult latch;Early term 37-38.6wks P1, 11 hour ETI female infant. Per mom, she had very fast labour , infant was DI/DI twin but twin B vanished, empty gestational sac at [redacted] weeks gestation. Mom tobacco smoker. Infant had two voids, LC changed a void diaper and stool ( meconium) while in room. Mom was concern infant been sleepy and not ate since 1400 pm, per mom, not one discussed hand expression with her and she doesn't know how to do it. Mom attempted to latch infant on left breast,  Mom pre-pumped breast prior to latching infant due to her left breast being ( flat) after a few attempts mom used 20 mm NS that was given to her by RN earlier. Infant suckled less than 3 minutes and then stopped only held breast in his mouth. Mom did hand expression and infant took 4 mls of colostrum by spoon. LC notice infant had void and stool diaper which she changed. Mom was given hand pump by RN but not DEBP. Mom did not want to use DEBP at this time she very tired. LC explained how to use and mom understand to pump every 3 hours for 15 minutes on initial setting. Mom shown how to use DEBP & how to disassemble, clean, & reassemble parts. LC discussed with RN to review again  with mom how to use pump, mom plans to pump after latching infant at breast at next feeding. Mom knows to pre-pump breast and apply 20 mm NS prior to latching infant on her left breast, mom understands to breastfeed infant according to hunger cues, 8 to 12 times within 24 hours.  Mom knows if infant doesn't latch at breast to hand express and give infant back volume by spoon within the first 24 hours of life. Parents will continue to do as much STS with infant as possible. Mom knows to call RN or LC if she has further questions, concerns or needs  assistance with latching infant at the breast.  Mom made aware of O/P services, breastfeeding support groups, community resources, and our phone # for post-discharge questions.  Maternal Data Formula Feeding for Exclusion: Yes Reason for exclusion: Mother's choice to formula and breast feed on admission Has patient been taught Hand Expression?: Yes Does the patient have breastfeeding experience prior to this delivery?: No  Feeding Feeding Type: Breast Fed  LATCH Score Latch: Too sleepy or reluctant, no latch achieved, no sucking elicited.  Audible Swallowing: None  Type of Nipple: Flat (Only left breast is flat)  Comfort (Breast/Nipple): Soft / non-tender  Hold (Positioning): Assistance needed to correctly position infant at breast and maintain latch.  LATCH Score: 4  Interventions Interventions: Breast feeding basics reviewed;Assisted with latch;Skin to skin;Adjust position;Breast compression;Support pillows;Hand pump;DEBP;Position options;Expressed milk;Pre-pump if needed;Hand express;Breast massage  Lactation Tools Discussed/Used Tools: Nipple Dorris Carnes;Pump (Mom given 20 mm NS by RN) Nipple shield size: 20 Breast pump type: Double-Electric Breast Pump;Manual WIC Program: No Pump Review: Setup, frequency, and cleaning;Milk Storage Initiated by:: Danelle Earthly, IBCLC Date initiated:: 05/03/20   Consult Status Consult Status: Follow-up Date: 05/04/20 Follow-up type: In-patient    Danelle Earthly 05/03/2020, 10:33 PM

## 2020-05-03 NOTE — Anesthesia Preprocedure Evaluation (Signed)
Anesthesia Evaluation  Patient identified by MRN, date of birth, ID band Patient awake    Reviewed: Allergy & Precautions, Patient's Chart, lab work & pertinent test results  History of Anesthesia Complications Negative for: history of anesthetic complications  Airway Mallampati: II  TM Distance: >3 FB Neck ROM: Full    Dental no notable dental hx.    Pulmonary former smoker,  Quit smoking 2017   Pulmonary exam normal breath sounds clear to auscultation       Cardiovascular negative cardio ROS Normal cardiovascular exam Rhythm:Regular Rate:Normal     Neuro/Psych PSYCHIATRIC DISORDERS Depression negative neurological ROS     GI/Hepatic negative GI ROS, Neg liver ROS,   Endo/Other  negative endocrine ROS  Renal/GU negative Renal ROS  negative genitourinary   Musculoskeletal  (+) Arthritis , Osteoarthritis,  Chronic tramadol use   Abdominal   Peds negative pediatric ROS (+)  Hematology negative hematology ROS (+) hct 37.1, plt 259   Anesthesia Other Findings   Reproductive/Obstetrics (+) Pregnancy                             Anesthesia Physical Anesthesia Plan  ASA: II and emergent  Anesthesia Plan: Epidural   Post-op Pain Management:    Induction:   PONV Risk Score and Plan: 2  Airway Management Planned: Natural Airway  Additional Equipment: None  Intra-op Plan:   Post-operative Plan:   Informed Consent: I have reviewed the patients History and Physical, chart, labs and discussed the procedure including the risks, benefits and alternatives for the proposed anesthesia with the patient or authorized representative who has indicated his/her understanding and acceptance.       Plan Discussed with:   Anesthesia Plan Comments:         Anesthesia Quick Evaluation

## 2020-05-03 NOTE — Anesthesia Procedure Notes (Signed)
Epidural Patient location during procedure: OB Start time: 05/03/2020 10:46 AM End time: 05/03/2020 10:54 AM  Staffing Anesthesiologist: Lannie Fields, DO Performed: anesthesiologist   Preanesthetic Checklist Completed: patient identified, IV checked, risks and benefits discussed, monitors and equipment checked, pre-op evaluation and timeout performed  Epidural Patient position: sitting Prep: DuraPrep and site prepped and draped Patient monitoring: continuous pulse ox, blood pressure, heart rate and cardiac monitor Approach: midline Location: L3-L4 Injection technique: LOR air  Needle:  Needle type: Tuohy  Needle gauge: 17 G Needle length: 9 cm Needle insertion depth: 5 cm Catheter type: closed end flexible Catheter size: 19 Gauge Catheter at skin depth: 10 cm Test dose: negative  Assessment Sensory level: T8 Events: blood not aspirated, injection not painful, no injection resistance, no paresthesia and negative IV test  Additional Notes Patient identified. Risks/Benefits/Options discussed with patient including but not limited to bleeding, infection, nerve damage, paralysis, failed block, incomplete pain control, headache, blood pressure changes, nausea, vomiting, reactions to medication both or allergic, itching and postpartum back pain. Confirmed with bedside nurse the patient's most recent platelet count. Confirmed with patient that they are not currently taking any anticoagulation, have any bleeding history or any family history of bleeding disorders. Patient expressed understanding and wished to proceed. All questions were answered. Sterile technique was used throughout the entire procedure. Please see nursing notes for vital signs. Test dose was given through epidural catheter and negative prior to continuing to dose epidural or start infusion. Warning signs of high block given to the patient including shortness of breath, tingling/numbness in hands, complete motor  block, or any concerning symptoms with instructions to call for help. Patient was given instructions on fall risk and not to get out of bed. All questions and concerns addressed with instructions to call with any issues or inadequate analgesia.  Reason for block:procedure for pain

## 2020-05-03 NOTE — MAU Note (Signed)
Patient started LOF (clear) at 0030, with a large gush of fluid within the past 10 minutes.

## 2020-05-03 NOTE — Discharge Summary (Signed)
Postpartum Discharge Summary     Patient Name: Wendy Allen DOB: [birth year] MRN: [id number]  Date of admission: 05/03/2020 Delivery date:05/03/2020  Delivering provider: Laury Deep  Date of discharge: 05/04/2020  Admitting diagnosis: PROM (premature rupture of membranes) [O42.90] Intrauterine pregnancy: [redacted]w[redacted]d    Secondary diagnosis:  Active Problems:   Supervision of normal first pregnancy   Chronic prescription opiate use   Twin pregnancy   Rh negative state in antepartum period   Asymptomatic bacteriuria during pregnancy in first trimester   PROM (premature rupture of membranes)  Additional problems: None     Discharge diagnosis: Term Pregnancy Delivered                                              Post partum procedures:rhogam Augmentation: Pitocin, Cytotec and IP Foley - Foley was removed shortly after insertion Complications: None  Hospital course: Onset of Labor With Vaginal Delivery      29y.o. yo G1P1001 at 350w4das admitted in Latent Labor on 05/03/2020. Patient had an uncomplicated labor course as follows:  Membrane Rupture Time/Date: 12:30 AM ,05/03/2020   Delivery Method:Vaginal, Spontaneous  Episiotomy: None  Lacerations:  2nd degree;Sulcus;Vaginal;Perineal  Patient had an uncomplicated postpartum course.  She is ambulating, tolerating a regular diet, passing flatus, and urinating well. Patient is discharged home in stable condition on 05/04/20.  Newborn Data: Birth date:05/03/2020  Birth time:11:06 AM  Gender:Female  Living status:Living  Apgars:7 ,9  Weight:3130 g   Magnesium Sulfate received: No BMZ received: No Rhophylac:Yes MMR:N/A T-DaP:Given prenatally Flu: No Transfusion:No  Physical exam  Vitals:   05/03/20 1430 05/03/20 1821 05/03/20 2300 05/04/20 0300  BP: 101/65 (!) 101/59 (!) 93/57 104/70  Pulse: 68 74 79 82  Resp: 20 18 17 16   Temp: 98.6 F (37 C) 98.2 F (36.8 C) 97.8 F (36.6 C) 98.1 F (36.7 C)  TempSrc: Oral  Oral  Oral  SpO2: 98% 99% 99% 99%  Weight:      Height:       General: alert, cooperative and no distress Lochia: appropriate Uterine Fundus: firm Incision: N/A DVT Evaluation: No evidence of DVT seen on physical exam. Labs: Lab Results  Component Value Date   WBC 16.8 (H) 05/03/2020   HGB 12.4 05/03/2020   HCT 37.1 05/03/2020   MCV 92.8 05/03/2020   PLT 259 05/03/2020   CMP Latest Ref Rng & Units 01/07/2017  Glucose 65 - 99 mg/dL 88  BUN 6 - 20 mg/dL 12  Creatinine 0.57 - 1.00 mg/dL 0.86  Sodium 134 - 144 mmol/L 137  Potassium 3.5 - 5.2 mmol/L 4.9  Chloride 96 - 106 mmol/L 101  CO2 18 - 29 mmol/L 21  Calcium 8.7 - 10.2 mg/dL 10.1  Total Protein 6.0 - 8.5 g/dL 6.8  Total Bilirubin 0.0 - 1.2 mg/dL 0.5  Alkaline Phos 39 - 117 IU/L 56  AST 0 - 40 IU/L 11  ALT 0 - 32 IU/L 14   Edinburgh Score: Edinburgh Postnatal Depression Scale Screening Tool 05/03/2020  I have been able to laugh and see the funny side of things. 0  I have looked forward with enjoyment to things. 0  I have blamed myself unnecessarily when things went wrong. 0  I have been anxious or worried for no good reason. 0  I have felt scared or panicky for no good reason.  0  Things have been getting on top of me. 0  I have been so unhappy that I have had difficulty sleeping. 0  I have felt sad or miserable. 0  I have been so unhappy that I have been crying. 0  The thought of harming myself has occurred to me. 0  Edinburgh Postnatal Depression Scale Total 0     After visit meds:  Allergies as of 05/04/2020      Reactions   Hydrocodone-acetaminophen Nausea Only   Vicodin [hydrocodone-acetaminophen] Nausea And Vomiting   Ortho Tri-cyclen [norgestimate-eth Estradiol] Rash   Tetracyclines & Related Rash      Medication List    STOP taking these medications   traMADol 50 MG tablet Commonly known as: ULTRAM     TAKE these medications   ibuprofen 600 MG tablet Commonly known as: ADVIL Take 1 tablet (600 mg  total) by mouth every 6 (six) hours.   PNV Prenatal Plus Multivitamin 27-1 MG Tabs Take 1 tablet by mouth daily.   senna-docusate 8.6-50 MG tablet Commonly known as: Senokot-S Take 2 tablets by mouth daily. Start taking on: May 05, 2020        Discharge home in stable condition Infant Feeding: Bottle and Breast Infant Disposition:home with mother Discharge instruction: per After Visit Summary and Postpartum booklet. Activity: Advance as tolerated. Pelvic rest for 6 weeks.  Diet: routine diet Future Appointments: Future Appointments  Date Time Provider [place]  06/08/2020  9:10 AM Myrtis Ser, CNM [facility]   Follow up Visit:   Please schedule this patient for a In person postpartum visit in 4 weeks with the following provider: Any provider. Additional Postpartum F/U:2 wks mood check  High risk pregnancy complicated by: vanishing twin, smoker and chronic tramadol use Delivery mode:  Vaginal, Spontaneous  Anticipated Birth Control:  Unsure--potentially Nexplanon    05/04/2020 Melina Schools, DO

## 2020-05-03 NOTE — H&P (Signed)
OBSTETRIC ADMISSION HISTORY AND PHYSICAL  Wendy Allen is a 29 y.o. female G1P0000 with IUP at [redacted]w[redacted]d by LMP c/w 8wk u/s presenting for PROM (9/14 @0030 ). She is having back pain, but not feeling contractions. She reports +FMs, no VB, no blurry vision, headaches or peripheral edema, and RUQ pain.  She plans on breast feeding. She is undecided for birth control. She received her prenatal care at 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 Complication of anesthesia    woke up crying after anesthesia  . Dyslipidemia 06/18/2016  . Family history of adverse reaction to anesthesia    pt's mother has hx. of post-op N/V  . Vitamin D deficiency 06/18/2016    Past Surgical History: Past Surgical History:  Procedure Laterality Date  . ANKLE ARTHROSCOPY Right 2008; 2016  . ANKLE ARTHROSCOPY WITH ARTHRODESIS Right 03/29/2016   Procedure: RIGHT ANKLE ARTHROSCOPY WITH ARTHRODESIS;  Surgeon: 05/29/2016, MD;  Location: [facility];  Service: Orthopedics;  Laterality: Right;  . ANKLE SURGERY Right 2018   screws removed    Obstetrical History: OB History    Gravida  1   Para  0   Term  0   Preterm  0   AB  0   Living  0     SAB  0   TAB  0   Ectopic  0   Multiple  0   Live Births              Social History Social History   Socioeconomic History  . Marital status: Single    Spouse name: Not on file  . Number of children: Not on file  . Years of education: Not on file  . Highest education level: Not on file  Occupational History  . Not on file  Tobacco Use  . Smoking status: Former  Smoker    Packs/day: 0.00    Years: 8.00    Pack years: 0.00    Types: Cigarettes    Quit date: 02/23/2016    Years since quitting: 4.1  . Smokeless tobacco: Never Used  Vaping Use  . Vaping Use: Never used  Substance and Sexual Activity  . Alcohol use: No  . Drug use: No  . Sexual activity: Yes    Birth control/protection: None  Other Topics Concern  . Not on file  Social History Narrative  . Not on file   Social Determinants of Health   Financial Resource Strain: Low Risk   . Difficulty of Paying Living Expenses: Not hard at all  Food Insecurity: No Food Insecurity  . Worried About 04/25/2016 in the Last Year: Never true  . Ran Out of Food in the Last Year: Never true  Transportation Needs: No Transportation Needs  . Lack of Transportation (Medical): No  . Lack of Transportation (Non-Medical): No  Physical Activity: Insufficiently Active  . Days of Exercise per Week: 2 days  . Minutes of Exercise per Session: 20 min  Stress: Stress Concern Present  . Feeling of Stress : To some extent  Social Connections: Moderately Isolated  . Frequency  of Communication with Friends and Family: More than three times a week  . Frequency of Social Gatherings with Friends and Family: More than three times a week  . Attends Religious Services: Never  . Active Member of Clubs or Organizations: No  . Attends Banker Meetings: Never  . Marital Status: Living with partner    Family History: Family History  Problem Relation Age of Onset  . Hypertension Father   . Hyperlipidemia Father   . Diabetes Paternal Grandmother   . Hyperlipidemia Paternal Grandmother   . Hypertension Paternal Grandmother   . Cancer Paternal Grandmother        skin   . Hypertension Paternal Grandfather   . Hyperlipidemia Paternal Grandfather   . Anesthesia problems Mother        post-op N/V  . Breast cancer Mother 56  . Rheum arthritis Maternal Uncle     Allergies: Allergies   Allergen Reactions  . Hydrocodone-Acetaminophen Nausea Only  . Vicodin [Hydrocodone-Acetaminophen] Nausea And Vomiting  . Ortho Tri-Cyclen [Norgestimate-Eth Estradiol] Rash  . Tetracyclines & Related Rash    Medications Prior to Admission  Medication Sig Dispense Refill Last Dose  . Prenatal Vit-Fe Fumarate-FA (PNV PRENATAL PLUS MULTIVITAMIN) 27-1 MG TABS Take 1 tablet by mouth daily. 90 tablet 3   . traMADol (ULTRAM) 50 MG tablet Take by mouth every 6 (six) hours as needed.  (Patient not taking: Reported on 02/29/2020)        Review of Systems   All systems reviewed and negative except as stated in HPI  Blood pressure 125/78, pulse 84, temperature 98.1 F (36.7 C), temperature source Oral, resp. rate 18, height 5\' 10"  (1.778 m), weight 90.2 kg, last menstrual period 08/14/2019, SpO2 100 %. General appearance: alert, cooperative and no distress Lungs: normal respiratory effort Heart: regular rate and rhythm Abdomen: soft, non-tender; gravid Pelvic: as noted below Extremities: Homans sign is negative, no sign of DVT Presentation: cephalic per u/s in MAU Fetal monitoringBaseline: 120 bpm, Variability: Good {> 6 bpm), Accelerations: Reactive and Decelerations: 1 prolonged 4 minute decel after FB placement Uterine activity previously q1-2 minutes upon arrival, quiet after terbutaline Dilation: 1.5 Effacement (%): 20 Station: -1 Exam by:: Dr. [email or web address]    Prenatal labs: ABO, Rh: O/Negative/-- (03/29 1132) Antibody: Negative (07/12 0954) Rubella: 3.51 (03/29 1132) RPR: Non Reactive (07/12 0954)  HBsAg: Negative (03/29 1132)  HIV: Non Reactive (07/12 0954)  GBS: Negative/-- (09/07 1524)  2 hr Glucola passed Genetic screening  normal Anatomy 04-30-1980 normal except LVEICF  Prenatal Transfer Tool  Maternal Diabetes: No Genetic Screening: Normal Maternal Ultrasounds/Referrals: Isolated EIF (echogenic intracardiac focus) Fetal Ultrasounds or other Referrals:  None Maternal  Substance Abuse:  Yes:  Type: Smoker, tramadol use, stopped 11/2019 Significant Maternal Medications:  None Significant Maternal Lab Results: Rh negative  Results for orders placed or performed during the hospital encounter of 05/03/20 (from the past 24 hour(s))  POCT fern test   Collection Time: 05/03/20  2:47 AM  Result Value Ref Range   POCT Fern Test Positive = ruptured amniotic membanes     Patient Active Problem List   Diagnosis Date Noted  . Asymptomatic bacteriuria during pregnancy in first trimester 11/21/2019  . Rh negative state in antepartum period 11/18/2019  . Supervision of normal first pregnancy 11/16/2019  . Chronic prescription opiate use 11/16/2019  . Twin pregnancy 11/16/2019  . Stress fracture of metatarsal bone of right foot 07/14/2018  . Elevated cholesterol 10/07/2017  . Vitamin D deficiency  06/18/2016  . Dyslipidemia 06/18/2016  . Impingement syndrome of left shoulder 09/10/2013  . Depression 04/28/2013    Assessment/Plan:  LINZY LAURY is a 29 y.o. G1P0000 at [redacted]w[redacted]d here for PROM (9/14 @0030 ).  #Labor: Patient presented to MAU with PROM, +fern. On exam she was 1.5/20/-1 so FB placement was completed. After FB placement patient with 4 minute decel so FB removed after discussion with Dr. 03-09-1986 and terbutaline administered. Previously contracting q1-2 minutes, toco now quiet. Will give patient 1 hour and consider augmentation at that time.  #Pain: PRN #FWB: Cat 2 #ID: GBS neg #MOF: breast #MOC: undecided, discussed options #Circ: yes #Rh neg: RhIg given 8/11. RhIg workup post partum.   10/11, MD  05/03/2020, 3:57 AM

## 2020-05-03 NOTE — Progress Notes (Signed)
Pt informed that the ultrasound is considered a limited OB ultrasound and is not intended to be a complete ultrasound exam.  Patient also informed that the ultrasound is not being completed with the intent of assessing for fetal or placental anomalies or any pelvic abnormalities.  Explained that the purpose of today's ultrasound is to assess for  presentation.  Patient acknowledges the purpose of the exam and the limitations of the study.  [facility]   Wendy Ausburn, NP    

## 2020-05-03 NOTE — Progress Notes (Signed)
Wendy Allen is a 29 y.o. G1P0000 at [redacted]w[redacted]d by LMP admitted for rupture of membranes  Subjective: Patient is sitting up at the side of the bed breathing with contractions, requesting more IV pain medication. Last dose of buccal Cytotec was @ 0515. Patient not interested in Foley balloon re-insertion due to the decel in FHR; she's "scared."  Objective: BP 130/73   Pulse 86   Temp 98.2 F (36.8 C) (Axillary)   Resp 18   Ht 5\' 10"  (1.778 m)   Wt 90.2 kg   LMP 08/14/2019   SpO2 100%   BMI 28.54 kg/m  No intake/output data recorded. No intake/output data recorded.  FHT:  FHR: 125 bpm, variability: moderate,  accelerations:  Present,  decelerations:  Absent UC:   irregular, every 3-5 minutes SVE:   Dilation: 3 Effacement (%): 80 Station: 0 Exam by: 08/16/2019 CNM  Labs: Lab Results  Component Value Date   WBC 16.8 (H) 05/03/2020   HGB 12.4 05/03/2020   HCT 37.1 05/03/2020   MCV 92.8 05/03/2020   PLT 259 05/03/2020    Assessment / Plan: Spontaneous labor, progressing normally  Labor: Progressing normally  Start Pitocin 2x2  Preeclampsia:  n/a Fetal Wellbeing:  Category I Pain Control:  IV pain meds - considering epidural I/D:  n/a Anticipated MOD:  NSVD  05/05/2020, CNM 05/03/2020, 9:33 AM

## 2020-05-04 ENCOUNTER — Encounter: Payer: BC Managed Care – PPO | Admitting: Advanced Practice Midwife

## 2020-05-04 ENCOUNTER — Encounter (HOSPITAL_COMMUNITY): Payer: Self-pay | Admitting: Obstetrics and Gynecology

## 2020-05-04 MED ORDER — SENNOSIDES-DOCUSATE SODIUM 8.6-50 MG PO TABS: 2.0000 | ORAL_TABLET | ORAL | 0 refills | Status: AC

## 2020-05-04 MED ORDER — IBUPROFEN 600 MG PO TABS: 600.0000 mg | ORAL_TABLET | Freq: Four times a day (QID) | ORAL | 0 refills | Status: DC

## 2020-05-04 NOTE — Progress Notes (Signed)
MOB was referred for history of depression/anxiety. * Referral screened out by Clinical Social Worker because none of the following criteria appear to apply: ~ History of anxiety/depression during this pregnancy, or of post-partum depression following prior delivery. ~ Diagnosis of anxiety and/or depression within last 3 years. Per further chart review, MOB diagnosed with depression in 2014.  OR * MOB's symptoms currently being treated with medication and/or therapy.   CSW also aware that MOB scored 0 on Edinburgh with no concerns to CSW. Please reconsult CSW if MOB wishes to speak or if further needs arise.     Claude Manges Samir Ishaq, MSW, LCSW [facility] [phone]

## 2020-05-04 NOTE — Progress Notes (Signed)
Patient ID: Wendy Allen, female   DOB: [birth year], 29 y.o.   MRN: [id number]  POSTPARTUM PROGRESS NOTE  Post Partum Day 1  Subjective:  Wendy Allen is a 29 y.o. G1P1001 s/p SVD at [redacted]w[redacted]d. No acute events overnight. Pt denies problems with ambulating, voiding or po intake. She denies nausea or vomiting. Pain is well controlled and improved from yesterday. She has had flatus. She has not had bowel movement. Lochia small and significantly decreased from yesterday. Says she is feeling well and hoping to go home today.  Objective: Blood pressure 104/70, pulse 82, temperature 98.1 F (36.7 C), temperature source Oral, resp. rate 16, height 5\' 10"  (1.778 m), weight 90.2 kg, last menstrual period 08/14/2019, SpO2 99 %, unknown if currently breastfeeding.   Physical Exam:  General: alert, cooperative and no distress Chest: no respiratory distress, breathing comfortably on room air Heart: regular rate, distal pulses intact Abdomen: soft, nontender Uterine Fundus: firm, minimal tenderness DVT Evaluation: No calf swelling or tenderness Extremities: no edema Skin: warm, dry  Recent Labs    05/03/20 0417  HGB 12.4  HCT 37.1    Assessment/Plan: Wendy Allen is a 29 y.o. G1P1001 s/p SVD at 106w4d   PPD#1- Doing well, pain and bleeding decreased, feels ready to go home Contraception: undecided, wants to talk with her outpatient OB/GYN provider to decide Circ: needs ordered/consented Feeding: breast  Dispo: Plan for discharge later today or tomorrow.   LOS: 1 day   [redacted]w[redacted]d, Medical Student 05/04/2020, 7:51 AM

## 2020-05-04 NOTE — Anesthesia Postprocedure Evaluation (Signed)
Anesthesia Post Note  [facility]: [facility]  Procedure(s) Performed: AN AD HOC LABOR EPIDURAL     Patient location during evaluation: Mother Baby Anesthesia Type: Epidural Level of consciousness: awake and alert Pain management: pain level controlled Vital Signs Assessment: post-procedure vital signs reviewed and stable Respiratory status: spontaneous breathing, nonlabored ventilation and respiratory function stable Cardiovascular status: stable Postop Assessment: no headache, no backache and epidural receding Anesthetic complications: no   No complications documented.  Last Vitals:  Vitals:   05/03/20 2300 05/04/20 0300  BP: (!) 93/57 104/70  Pulse: 79 82  Resp: 17 16  Temp: 36.6 C 36.7 C  SpO2: 99% 99%    Last Pain:  Vitals:   05/04/20 0300  TempSrc: Oral  PainSc: 1    Pain Goal:                   Dailan Pfalzgraf

## 2020-05-04 NOTE — Discharge Instructions (Signed)

## 2020-05-17 ENCOUNTER — Ambulatory Visit (INDEPENDENT_AMBULATORY_CARE_PROVIDER_SITE_OTHER): Payer: BC Managed Care – PPO | Admitting: Women's Health

## 2020-05-17 ENCOUNTER — Encounter: Payer: Self-pay | Admitting: Women's Health

## 2020-05-17 VITALS — BP 119/83 | HR 67 | Ht 70.0 in | Wt 179.0 lb

## 2020-05-17 DIAGNOSIS — F418 Other specified anxiety disorders: Secondary | ICD-10-CM

## 2020-05-17 DIAGNOSIS — Z3202 Encounter for pregnancy test, result negative: Secondary | ICD-10-CM | POA: Diagnosis not present

## 2020-05-17 LAB — POCT URINE PREGNANCY: Preg Test, Ur: NEGATIVE

## 2020-05-17 NOTE — Patient Instructions (Signed)
Tips To Increase Milk Supply  Lots of water! Enough so that your urine is clear  Plenty of calories, if you're not getting enough calories, your milk supply can decrease  Breastfeed/pump often, every 2-3 hours x 20-23mins  Fenugreek 3 pills 3 times a day, this may make your urine smell like maple syrup  Mother's Milk Tea  Lactation cookies, google for the recipe  Real oatmeal  Body Armor sports drinks  Greater Than drink

## 2020-05-17 NOTE — Progress Notes (Signed)
   GYN VISIT Patient name: Wendy Allen MRN [id number]  Date of birth: Sep 04, 1990 Chief Complaint:   Postpartum mood check  History of Present Illness:   Wendy Allen is a 29 y.o. G68P1001 Caucasian female 2wks s/p SVB being seen today for mood check d/t h/o dep/anx.  Doing great, 'in love with my baby', no problems. Not on meds for dep/anx. Breast and bottlefeeding, supply insufficient.    Depression screen [facility] 2/9 02/29/2020 11/16/2019 10/07/2017 06/12/2016  Decreased Interest 0 0 0 0  Down, Depressed, Hopeless 0 0 0 0  PHQ - 2 Score 0 0 0 0  Altered sleeping 0 2 - -  Tired, decreased energy 2 2 - -  Change in appetite 0 1 - -  Feeling bad or failure about yourself  0 0 - -  Trouble concentrating 0 0 - -  Moving slowly or fidgety/restless 0 0 - -  Suicidal thoughts 0 0 - -  PHQ-9 Score 2 5 - -  Difficult doing work/chores - Not difficult at all - -    No LMP recorded. The current method of family planning is abstinence. Plans POPs Review of Systems:   Pertinent items are noted in HPI Denies fever/chills, dizziness, headaches, visual disturbances, fatigue, shortness of breath, chest pain, abdominal pain, vomiting, abnormal vaginal discharge/itching/odor/irritation, problems with periods, bowel movements, urination, or intercourse unless otherwise stated above.  Pertinent History Reviewed:  Reviewed past medical,surgical, social, obstetrical and family history.  Reviewed problem list, medications and allergies. Physical Assessment:   Vitals:   05/17/20 1116  BP: 119/83  Pulse: 67  Weight: 179 lb (81.2 kg)  Height: 5\' 10"  (1.778 m)  Body mass index is 25.68 kg/m.       Physical Examination:   General appearance: alert, well appearing, and in no distress  Mental status: alert, oriented to person, place, and time  Skin: warm & dry   Cardiovascular: normal heart rate noted  Respiratory: normal respiratory effort, no distress  Abdomen: soft, non-tender   Pelvic:  examination not indicated  Extremities: no edema   Chaperone: n/a    Results for orders placed or performed in visit on 05/17/20 (from the past 24 hour(s))  POCT urine pregnancy   Collection Time: 05/17/20 11:24 AM  Result Value Ref Range   Preg Test, Ur Negative Negative    Assessment & Plan:  1) 2wks s/p SVB mood check> doing great, EPDS 0  2) Decreased milk supply> gave printed tips/tricks  Meds: No orders of the defined types were placed in this encounter.   Orders Placed This Encounter  Procedures  . POCT urine pregnancy    Return for As scheduled for pp visit.  05/19/20 CNM, [facility] 05/17/2020 11:36 AM

## 2020-06-08 ENCOUNTER — Ambulatory Visit (INDEPENDENT_AMBULATORY_CARE_PROVIDER_SITE_OTHER): Payer: BC Managed Care – PPO | Admitting: Advanced Practice Midwife

## 2020-06-08 ENCOUNTER — Encounter: Payer: Self-pay | Admitting: Advanced Practice Midwife

## 2020-06-08 ENCOUNTER — Other Ambulatory Visit: Payer: Self-pay

## 2020-06-08 MED ORDER — NORGESTIMATE-ETH ESTRADIOL 0.25-35 MG-MCG PO TABS: 1.0000 | ORAL_TABLET | Freq: Every day | ORAL | 11 refills | Status: DC

## 2020-06-08 NOTE — Progress Notes (Signed)
POSTPARTUM VISIT Patient name: Wendy Allen MRN [id number]  Date of birth: 28-Jan-1991 Chief Complaint:   Postpartum Care  History of Present Illness:   Wendy Allen is a 29 y.o. G63P1001 Caucasian female being seen today for a postpartum visit. She is 5 weeks postpartum following a spontaneous vaginal delivery at 37.4 gestational weeks. IOL: Yes, for PROM. Anesthesia: epidural.  Laceration: bilat sulcus, 2nd deg perineal.  Complications: none. Inpatient contraception: no.   Pregnancy complicated by Rh neg status (Rhogam given prenatally & PP). Tobacco use: no. Substance use disorder: yes (daily Tramadol use from March 2020-April 2021 due to foot pain s/p fusion). Last pap smear: Feb 2019 and results were normal. Next pap smear due: Feb 2022 No LMP recorded.  Postpartum course has been uncomplicated. Bleeding no bleeding. Bowel function is slightly constipated, but stool softener helping. Bladder function is normal. Urinary incontinence? No, fecal incontinence? No Patient is not sexually active. Last sexual activity: prior to delivery.  Desired contraception: OCPs. Patient does not know want a pregnancy in the future.  Desired family size is unsure children.   Upstream - 06/08/20 0920      Pregnancy Intention Screening   Does the patient want to become pregnant in the next year? No    Does the patient's partner want to become pregnant in the next year? No    Would the patient like to discuss contraceptive options today? Yes      Contraception Wrap Up   Current Method Abstinence    End Method Oral Contraceptive    Contraception Counseling Provided Yes          The pregnancy intention screening data noted above was reviewed. Potential methods of contraception were discussed. The patient elected to proceed with Oral Contraceptive.   Edinburgh Postpartum Depression Screening: negative  Edinburgh Postnatal Depression Scale - 06/08/20 0920      Edinburgh Postnatal Depression  Scale:  In the Past 7 Days   I have been able to laugh and see the funny side of things. 0    I have looked forward with enjoyment to things. 0    I have blamed myself unnecessarily when things went wrong. 0    I have been anxious or worried for no good reason. 0    I have felt scared or panicky for no good reason. 0    Things have been getting on top of me. 0    I have been so unhappy that I have had difficulty sleeping. 0    I have felt sad or miserable. 0    I have been so unhappy that I have been crying. 0    The thought of harming myself has occurred to me. 0    Edinburgh Postnatal Depression Scale Total 0          Baby's course has been uncomplicated. Baby is feeding by bottle. Infant has a pediatrician/family doctor? Yes.  Childcare strategy if returning to work/school: she is going to stay home for the time being.  Pt has material needs met for her and baby: Yes.   Review of Systems:   Pertinent items are noted in HPI Denies Abnormal vaginal discharge w/ itching/odor/irritation, headaches, visual changes, shortness of breath, chest pain, abdominal pain, severe nausea/vomiting, or problems with urination or bowel movements. Pertinent History Reviewed:  Reviewed past medical,surgical, obstetrical and family history.  Reviewed problem list, medications and allergies. OB History  Gravida Para Term Preterm AB Living  1 1 1  0 0 1  SAB TAB Ectopic Multiple Live Births  0 0 0 0 1    # Outcome Date GA Lbr Len/2nd Weight Sex Delivery Anes PTL Lv  1 Term 05/03/20 [id number]:30 / 00:11 6 lb 14.4 oz (3.13 kg) M Vag-Spont EPI  LIV   Physical Assessment:   Vitals:   06/08/20 0920  BP: 114/76  Pulse: 85  Weight: 182 lb (82.6 kg)  Body mass index is 26.11 kg/m.       Physical Examination:   General appearance: alert, well appearing, and in no distress  Mental status: alert, oriented to person, place, and time  Skin: warm & dry   Cardiovascular: normal heart rate noted    Respiratory: normal respiratory effort, no distress   Breasts: deferred, no complaints   Abdomen: soft, non-tender   Pelvic: examination not indicated (stated she felt she was healing well)  Rectal: no hemorrhoids  Extremities: no edema       No results found for this or any previous visit (from the past 24 hour(s)).  Assessment & Plan:  1) Postpartum exam 2) Five wks s/p spontaneous vaginal delivery 3) bottle feeding 4) Depression screening 5) Contraception management  Essential components of care per ACOG recommendations:  1.  Mood and well being:  . If positive depression screen, discussed and plan developed.  . If using tobacco we discussed reduction/cessation and risk of relapse . If current substance abuse, we discussed and referral to local resources was offered.   2. Infant care and feeding:  . If breastfeeding, discussed returning to work, pumping, breastfeeding-associated pain, guidance regarding return to fertility while lactating if not using another method. If needed, patient was provided with a letter to be allowed to pump q 2-3hrs to support lactation in a private location with access to a refrigerator to store breastmilk.   . Recommended that all caregivers be immunized for flu, pertussis and other preventable communicable diseases . If pt does not have material needs met for her/baby, referred to local resources for help obtaining these.  3. Sexuality, contraception and birth spacing . Provided guidance regarding sexuality, management of dyspareunia, and resumption of intercourse . Discussed avoiding interpregnancy interval <685ms and recommended birth spacing of 18 months  4. Sleep and fatigue . Discussed coping options for fatigue and sleep disruption . Encouraged family/partner/community support of 4 hrs of uninterrupted sleep to help with mood and fatigue  5. Physical recovery  . If pt had a C/S, assessed incisional pain and providing guidance on normal vs  prolonged recovery . If pt had a laceration, perineal healing and pain reviewed.  . If urinary or fecal incontinence, discussed management and referred to PT or uro/gyn if indicated  . Patient is safe to resume physical activity. Discussed attainment of healthy weight.  6.  Chronic disease management . Discussed pregnancy complications if any, and their implications for future childbearing and long-term maternal health. . Review recommendations for prevention of recurrent pregnancy complications, such as 17 hydroxyprogesterone caproate to reduce risk for recurrent PTB not applicable, or aspirin to reduce risk of preeclampsia not applicable. . Pt had GDM: No. If yes, 2hr GTT scheduled: not applicable. . Reviewed medications and non-pregnant dosing including consideration of whether pt is breastfeeding using a reliable resource such as LactMed: not applicable . Referred for f/u w/ PCP or subspecialist providers as indicated: not applicable  7. Health maintenance . Mammogram at [id number] earlier if indicated . Pap smears as indicated  Meds:  Meds ordered this encounter  Medications  . norgestimate-ethinyl estradiol (ORTHO-CYCLEN) 0.25-35 MG-MCG tablet    Sig: Take 1 tablet by mouth daily.    Dispense:  28 tablet    Refill:  11    Order Specific Question:   Supervising Provider    Answer:   Florian Buff [[id number]]    Follow-up: Return for Pap & Physical; after Feb 2022.   No orders of the defined types were placed in this encounter.   Myrtis Ser CNM 06/08/2020 9:53 AM

## 2020-06-20 DIAGNOSIS — M19072 Primary osteoarthritis, left ankle and foot: Secondary | ICD-10-CM | POA: Diagnosis not present

## 2020-06-20 DIAGNOSIS — M79671 Pain in right foot: Secondary | ICD-10-CM | POA: Diagnosis not present

## 2020-06-20 DIAGNOSIS — M25571 Pain in right ankle and joints of right foot: Secondary | ICD-10-CM | POA: Diagnosis not present

## 2020-06-20 DIAGNOSIS — M19071 Primary osteoarthritis, right ankle and foot: Secondary | ICD-10-CM | POA: Diagnosis not present

## 2020-06-22 DIAGNOSIS — Z0001 Encounter for general adult medical examination with abnormal findings: Secondary | ICD-10-CM | POA: Diagnosis not present

## 2020-06-22 DIAGNOSIS — Z23 Encounter for immunization: Secondary | ICD-10-CM | POA: Diagnosis not present

## 2020-07-20 ENCOUNTER — Telehealth: Payer: Self-pay | Admitting: *Deleted

## 2020-07-20 NOTE — Telephone Encounter (Signed)
Patient called stating a doctor with her insurance stated they called on 11/23 and spoke with Selena Batten.  Patient called to confirm if they did.

## 2021-05-08 ENCOUNTER — Other Ambulatory Visit: Payer: Self-pay | Admitting: Advanced Practice Midwife

## 2021-07-11 ENCOUNTER — Other Ambulatory Visit: Payer: Self-pay

## 2021-07-11 ENCOUNTER — Ambulatory Visit (HOSPITAL_COMMUNITY)
Admission: RE | Admit: 2021-07-11 | Discharge: 2021-07-11 | Disposition: A | Discharge status: Home / Self Care | Payer: 59 | Source: Ambulatory Visit | Attending: Adult Health Nurse Practitioner | Admitting: Adult Health Nurse Practitioner

## 2021-07-11 ENCOUNTER — Other Ambulatory Visit (HOSPITAL_COMMUNITY): Payer: Self-pay | Admitting: Adult Health Nurse Practitioner

## 2021-07-11 ENCOUNTER — Other Ambulatory Visit: Payer: Self-pay | Admitting: Adult Health Nurse Practitioner

## 2021-07-11 DIAGNOSIS — R109 Unspecified abdominal pain: Secondary | ICD-10-CM | POA: Insufficient documentation

## 2021-12-19 DIAGNOSIS — E782 Mixed hyperlipidemia: Secondary | ICD-10-CM | POA: Diagnosis not present

## 2021-12-19 DIAGNOSIS — M1A49X0 Other secondary chronic gout, multiple sites, without tophus (tophi): Secondary | ICD-10-CM | POA: Diagnosis not present

## 2021-12-19 DIAGNOSIS — Z79899 Other long term (current) drug therapy: Secondary | ICD-10-CM | POA: Diagnosis not present

## 2021-12-19 DIAGNOSIS — Z0000 Encounter for general adult medical examination without abnormal findings: Secondary | ICD-10-CM | POA: Diagnosis not present

## 2022-06-06 DIAGNOSIS — J029 Acute pharyngitis, unspecified: Secondary | ICD-10-CM | POA: Diagnosis not present

## 2022-06-06 DIAGNOSIS — K219 Gastro-esophageal reflux disease without esophagitis: Secondary | ICD-10-CM | POA: Diagnosis not present

## 2022-06-15 DIAGNOSIS — M19071 Primary osteoarthritis, right ankle and foot: Secondary | ICD-10-CM | POA: Diagnosis not present

## 2022-06-15 DIAGNOSIS — M19072 Primary osteoarthritis, left ankle and foot: Secondary | ICD-10-CM | POA: Diagnosis not present

## 2023-01-06 ENCOUNTER — Ambulatory Visit
Admission: EM | Admit: 2023-01-06 | Discharge: 2023-01-06 | Disposition: A | Discharge status: Home / Self Care | Payer: Medicaid Other

## 2023-01-06 ENCOUNTER — Encounter: Payer: Self-pay | Admitting: Emergency Medicine

## 2023-01-06 ENCOUNTER — Other Ambulatory Visit: Payer: Self-pay

## 2023-01-06 DIAGNOSIS — R21 Rash and other nonspecific skin eruption: Secondary | ICD-10-CM

## 2023-01-06 HISTORY — DX: Gout, unspecified: M10.9

## 2023-01-06 MED ORDER — CEPHALEXIN 500 MG PO CAPS: 500.0000 mg | ORAL_CAPSULE | Freq: Three times a day (TID) | ORAL | 0 refills | Status: AC

## 2023-01-06 MED ORDER — CETIRIZINE HCL 10 MG PO TABS: 10.0000 mg | ORAL_TABLET | Freq: Every day | ORAL | 0 refills | Status: AC

## 2023-01-06 MED ORDER — DEXAMETHASONE SODIUM PHOSPHATE 10 MG/ML IJ SOLN
10.0000 mg | Freq: Once | INTRAMUSCULAR | Status: AC
  Administered 2023-01-06: 10 mg via INTRAMUSCULAR

## 2023-01-06 NOTE — ED Triage Notes (Signed)
Pt reports possible insect bite/rash since Friday. Pt reports initial "bite" on left thigh and reports has progressively noticed similar "bites" have progressively spread to torso.   Reports has been taking otc antihistamine with minimal change in itching.

## 2023-01-06 NOTE — ED Provider Notes (Signed)
[facility]   [id number] 01/06/23 Arrival Time: 1020   Chief Complaint  Patient presents with   Insect Bite     SUBJECTIVE: History from: patient.  Wendy Allen is a 32 y.o. female who presented to the urgent care with a complaint of rash that started this past Friday.  She denies changes in soaps, detergents, or anyone with similar symptoms.  She will has a rash to her upper legs, torso and back.  She described as red, itchy and spreading.  She has tried OTC Benadryl with mild relief.  Denies aggravating factors.  Denies any other symptoms in the past.  She denies chills, fever, nausea, vomiting, diarrhea.    ROS: As per HPI.  All other pertinent ROS negative.      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 of post-op N/V   Gout    Vitamin D deficiency 06/18/2016   Past Surgical History:  Procedure Laterality Date   ANKLE ARTHROSCOPY Right 2008; 2016   ANKLE ARTHROSCOPY WITH ARTHRODESIS Right 03/29/2016   Procedure: RIGHT ANKLE ARTHROSCOPY WITH ARTHRODESIS;  Surgeon: Toni Arthurs, MD;  Location: [facility];  Service: Orthopedics;  Laterality: Right;   ANKLE SURGERY Right 2018   screws removed   Allergies  Allergen Reactions   Hydrocodone-Acetaminophen Nausea Only   Vicodin [Hydrocodone-Acetaminophen] Nausea And Vomiting   Ortho Tri-Cyclen [Norgestimate-Eth Estradiol] Rash   Tetracyclines & Related Rash   No current facility-administered medications on file prior to encounter.   Current Outpatient Medications on File Prior to Encounter  Medication Sig Dispense Refill   allopurinol (ZYLOPRIM) 300 MG tablet Take 300 mg by mouth daily.     atorvastatin (LIPITOR) 10 MG tablet Take 10 mg by mouth daily.     traMADol (ULTRAM) 50 MG tablet Take 50 mg  by mouth in the morning, at noon, and at bedtime. 1/2 a pill     Prenatal Vit-Fe Fumarate-FA (PNV PRENATAL PLUS MULTIVITAMIN) 27-1 MG TABS Take 1 tablet by mouth daily. 90 tablet 3   senna-docusate (SENOKOT-S) 8.6-50 MG tablet Take 2 tablets by mouth daily. (Patient not taking: Reported on 06/08/2020) 20 tablet 0   SPRINTEC 28 0.25-35 MG-MCG tablet TAKE (1) TABLET BY MOUTH ONCE DAILY. 28 tablet 0   Social History   Socioeconomic History   Marital status: Single    Spouse name: Not on file   Number of children: Not on file   Years of education: Not on file   Highest education level: Not on file  Occupational History   Not on file  Tobacco Use   Smoking status: Every Day    Packs/day: .5    Types: Cigarettes   Smokeless tobacco: Never  Vaping Use   Vaping Use: Never used  Substance and Sexual Activity   Alcohol use: No   Drug use: No   Sexual activity: Not Currently    Birth control/protection: Pill  Other Topics Concern   Not on file  Social History Narrative   Not on file   Social Determinants of Health   Financial Resource Strain: Low Risk  (02/29/2020)   Overall Financial Resource Strain (CARDIA)    Difficulty of Paying Living Expenses: Not hard at all  Food Insecurity: No Food Insecurity (02/29/2020)  Hunger Vital Sign    Worried About Running Out of Food in the Last Year: Never true    Ran Out of Food in the Last Year: Never true  Transportation Needs: No Transportation Needs (02/29/2020)   PRAPARE - Administrator, Civil Service (Medical): No    Lack of Transportation (Non-Medical): No  Physical Activity: Insufficiently Active (02/29/2020)   Exercise Vital Sign    Days of Exercise per Week: 2 days    Minutes of Exercise per Session: 20 min  Stress: Stress Concern Present (02/29/2020)   [employer] of Occupational Health - Occupational Stress Questionnaire    Feeling of Stress : To some extent  Social Connections: Moderately Isolated (02/29/2020)    Social Connection and Isolation Panel [NHANES]    Frequency of Communication with Friends and Family: More than three times a week    Frequency of Social Gatherings with Friends and Family: More than three times a week    Attends Religious Services: Never    Database administrator or Organizations: No    Attends Banker Meetings: Never    Marital Status: Living with partner  Intimate Partner Violence: Not At Risk (02/29/2020)   Humiliation, Afraid, Rape, and Kick questionnaire    Fear of Current or Ex-Partner: No    Emotionally Abused: No    Physically Abused: No    Sexually Abused: No   Family History  Problem Relation Age of Onset   Hypertension Father    Hyperlipidemia Father    Diabetes Paternal Grandmother    Hyperlipidemia Paternal Grandmother    Hypertension Paternal Grandmother    Cancer Paternal Grandmother        skin    Hypertension Paternal Grandfather    Hyperlipidemia Paternal Grandfather    Anesthesia problems Mother        post-op N/V   Breast cancer Mother 13   Rheum arthritis Maternal Uncle     OBJECTIVE:  Vitals:   01/06/23 1038  BP: 110/74  Pulse: 74  Resp: 20  Temp: 98 F (36.7 C)  TempSrc: Oral  SpO2: 99%     Physical Exam Vitals and nursing note reviewed.  Constitutional:      General: She is not in acute distress.    Appearance: Normal appearance. She is normal weight. She is not ill-appearing, toxic-appearing or diaphoretic.  HENT:     Head: Normocephalic.  Cardiovascular:     Rate and Rhythm: Normal rate and regular rhythm.     Pulses: Normal pulses.     Heart sounds: Normal heart sounds. No murmur heard.    No friction rub. No gallop.  Pulmonary:     Effort: Pulmonary effort is normal. No respiratory distress.     Breath sounds: Normal breath sounds. No stridor. No wheezing, rhonchi or rales.  Chest:     Chest wall: No tenderness.  Skin:    General: Skin is warm.     Findings: Rash present. Rash is macular.   Neurological:     Mental Status: She is alert and oriented to person, place, and time.      LABS:  No results found for this or any previous visit (from the past 24 hour(s)).   ASSESSMENT & PLAN:  1. Rash and nonspecific skin eruption     Meds ordered this encounter  Medications   cetirizine (ZYRTEC) 10 MG tablet    Sig: Take 1 tablet (10 mg total) by mouth at bedtime.  Dispense:  30 tablet    Refill:  0   dexamethasone (DECADRON) injection 10 mg   cephALEXin (KEFLEX) 500 MG capsule    Sig: Take 1 capsule (500 mg total) by mouth 3 (three) times daily for 5 days.    Dispense:  15 capsule    Refill:  0    Discharge instructions  Prescribed zyrtec  Decadron IM was given in office/use OTC hydrocortisone as needed for itching Keflex was prescribed for possible skin infection/take as directed Take as prescribed and to completion Follow up with PCP if symptoms persists Return or go to the ER if you have any new or worsening symptoms   Reviewed expectations re: course of current medical issues. Questions answered. Outlined signs and symptoms indicating need for more acute intervention. Patient verbalized understanding. After Visit Summary given.          Durward Parcel, FNP 01/06/23 1103

## 2023-01-06 NOTE — Discharge Instructions (Addendum)
Prescribed zyrtec  Decadron IM was given in office/use OTC hydrocortisone as needed for itching Keflex was prescribed for possible skin infection/take as directed Take as prescribed and to completion Follow up with PCP if symptoms persists Return or go to the ER if you have any new or worsening symptoms

## 2023-04-19 IMAGING — CT CT RENAL STONE PROTOCOL
2 of 4 series · 16 of 46 positions shown, 18 images · non-contrast
Comparison: None.

CLINICAL DATA: Left flank pain for 1 week.

EXAM:
CT ABDOMEN AND PELVIS WITHOUT CONTRAST
TECHNIQUE: Multidetector CT imaging of the abdomen and pelvis was performed
following the standard protocol without IV contrast.

[Series 2: axial st · axial · 0.91mm/px · z∈[+774,+1194]mm · 13 of 96 slices shown, 15 images]
[im 6/96  soft-tissue]
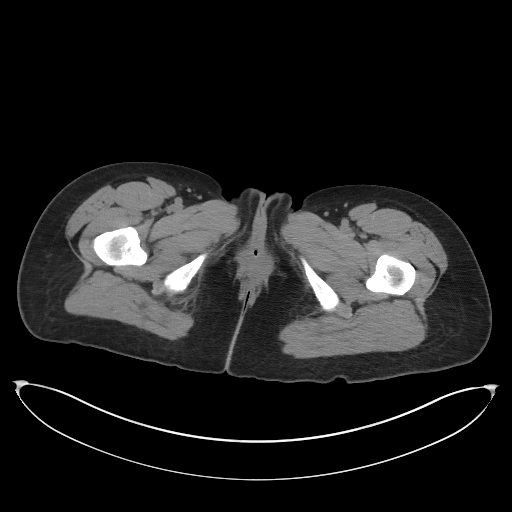
[im 6/96  bone]
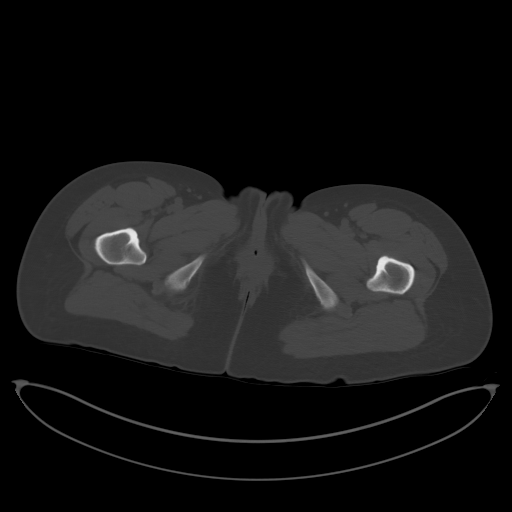
[im 11/96  soft-tissue]
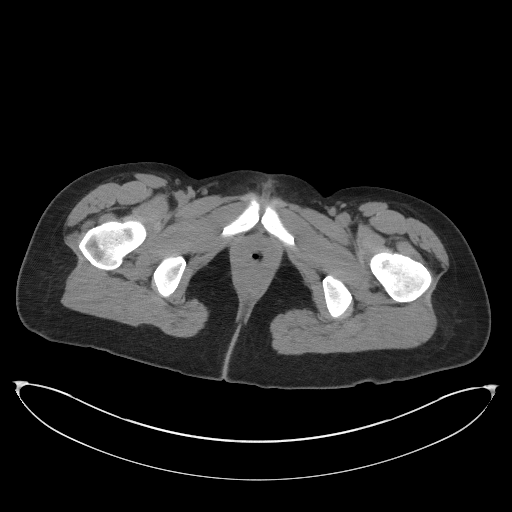
[im 22/96  soft-tissue]
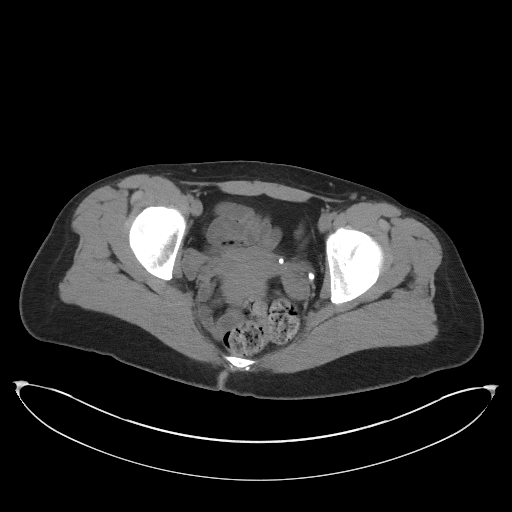
[im 27/96  soft-tissue]
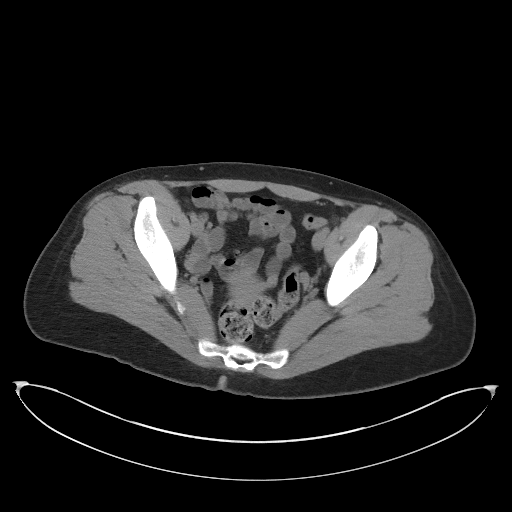
[im 32/96  soft-tissue]
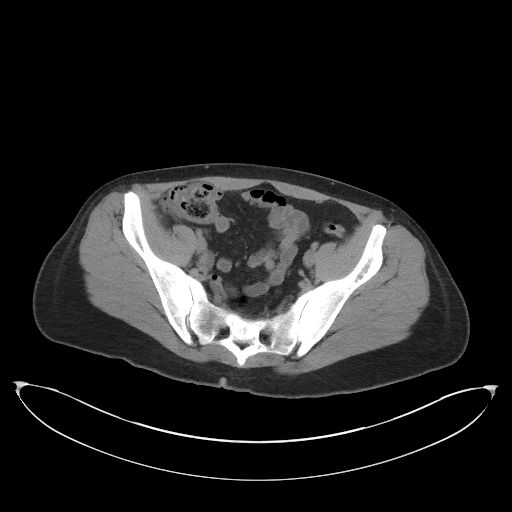
[im 43/96  soft-tissue]
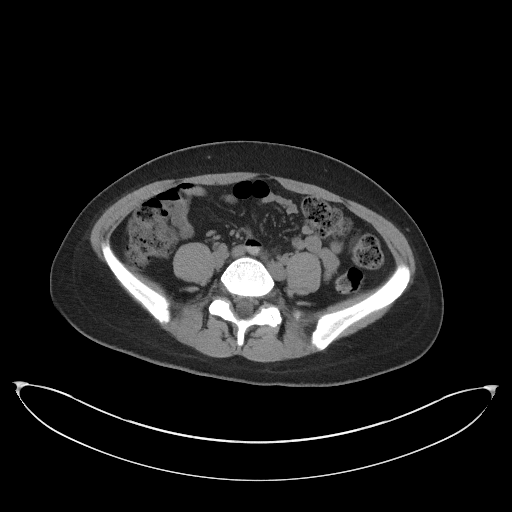
[im 48/96  soft-tissue]
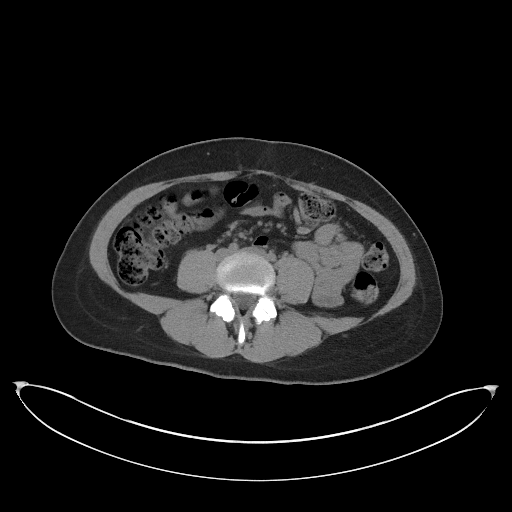
[im 53/96  soft-tissue]
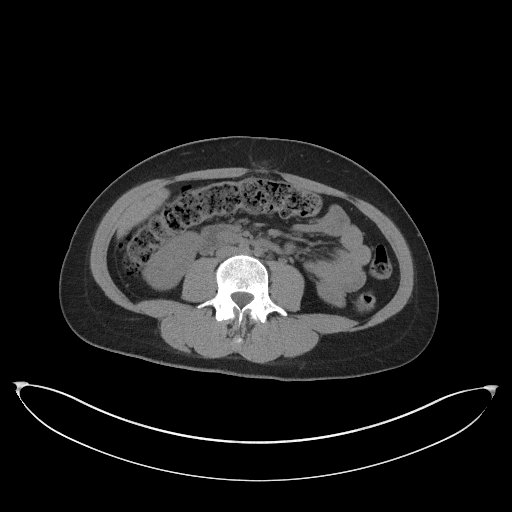
[im 64/96  soft-tissue]
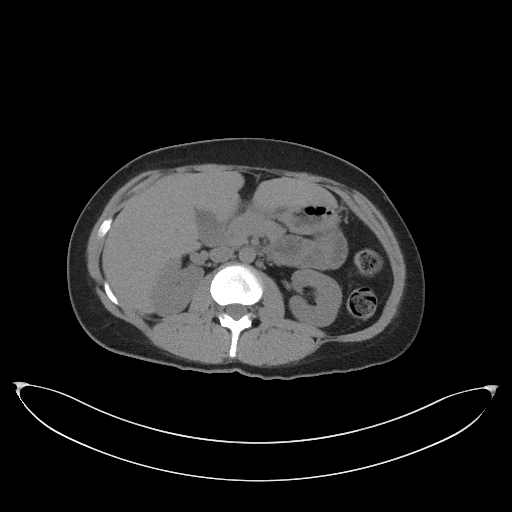
[im 64/96  bone]
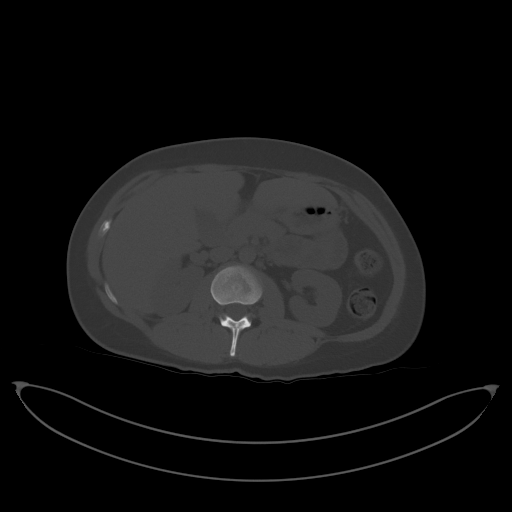
[im 69/96  soft-tissue]
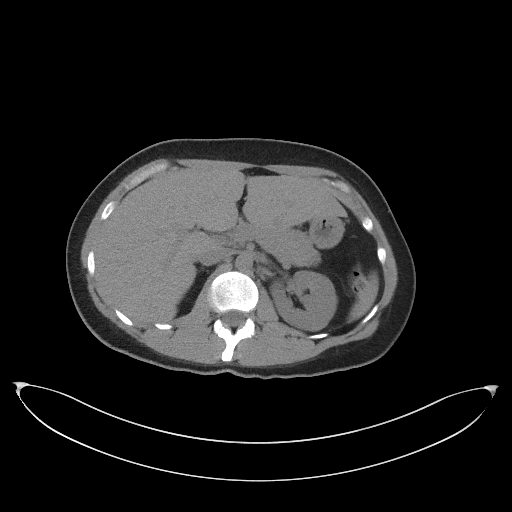
[im 74/96  soft-tissue]
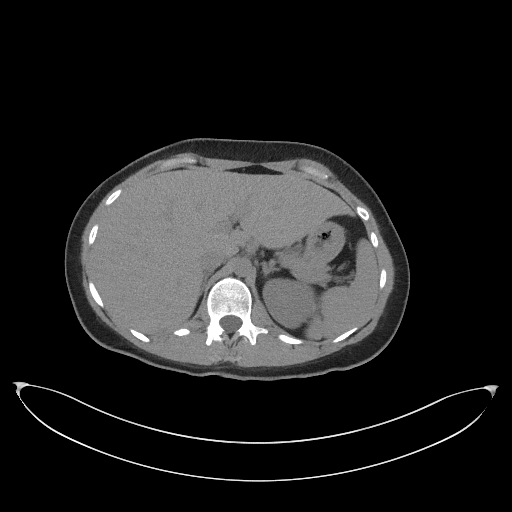
[im 85/96  soft-tissue]
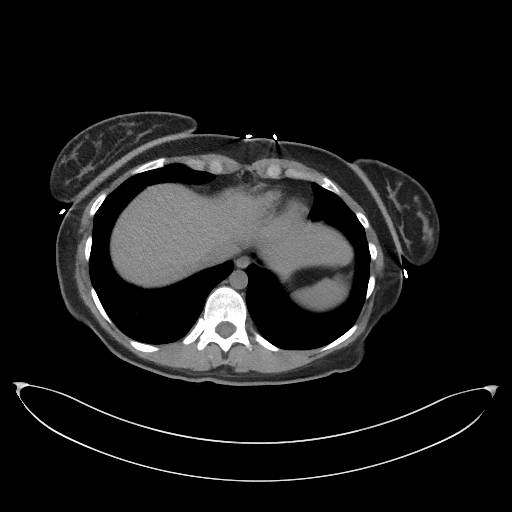
[im 90/96  soft-tissue]
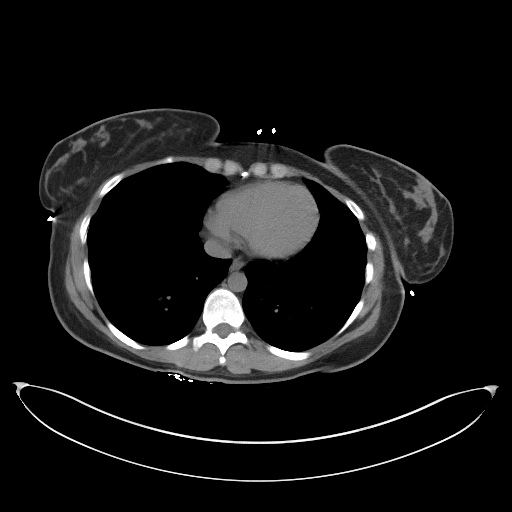

[Series 5: coronal st · coronal · 0.80mm/px · 3 of 101 slices shown]
[im 34/101  soft-tissue]
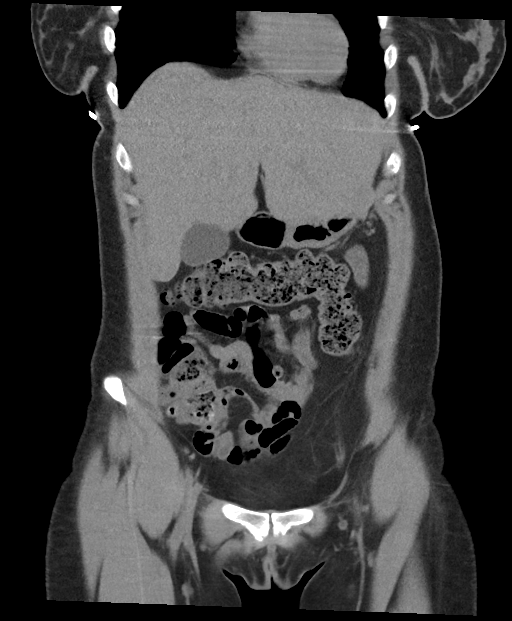
[im 45/101  soft-tissue]
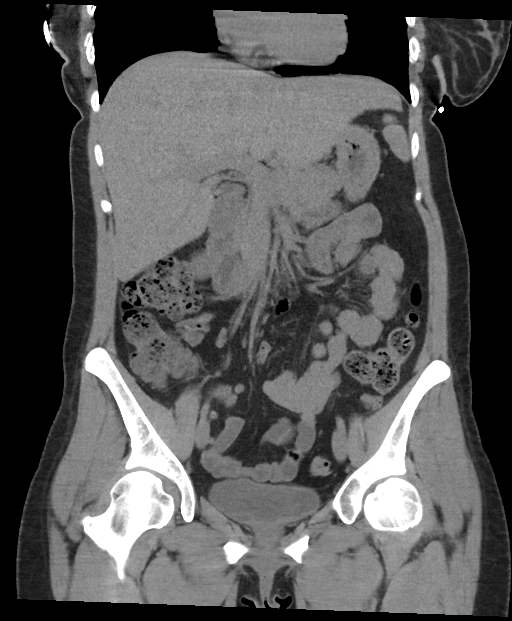
[im 56/101  soft-tissue]
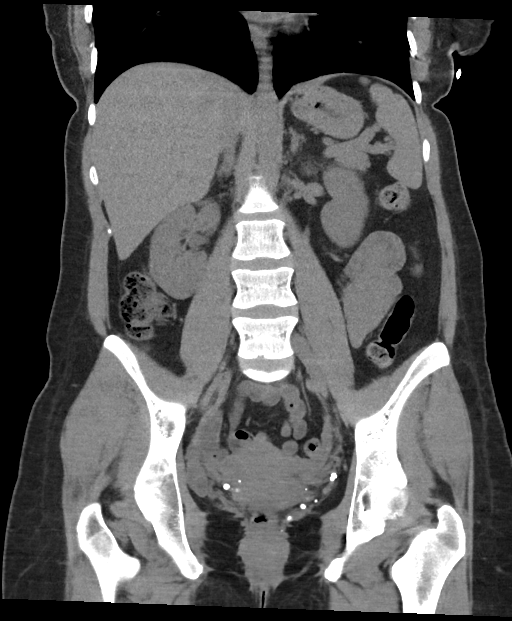

[16 of 46 positions shown; findings below may reference images not displayed]

FINDINGS: Lower chest: The lung bases are clear of acute process. No pleural
effusion or pulmonary lesions. The heart is normal in size. No
pericardial effusion. The distal esophagus and aorta are
unremarkable.

Hepatobiliary: No focal hepatic lesions or intrahepatic biliary
dilatation. The gallbladder is normal. No common bile duct
dilatation.

Pancreas: No mass, inflammation or ductal dilatation.

Spleen: Normal size.  No focal lesions.

Adrenals/Urinary Tract: The adrenal glands are normal.

No renal, ureteral or bladder calculi. No worrisome renal or bladder
lesions without contrast.

Stomach/Bowel: The stomach, duodenum, small bowel and colon are
grossly normal without oral contrast. No inflammatory changes, mass
lesions or obstructive findings. The appendix is normal.

Vascular/Lymphatic: Insert aorta noncontrast

Reproductive: The uterus and ovaries are unremarkable.

Other: No pelvic mass or adenopathy. No free pelvic fluid
collections. No inguinal mass or adenopathy. Small periumbilical
abdominal wall hernia containing fat. No findings for incarceration.

Musculoskeletal: No significant bony findings.
IMPRESSION: 1. No acute abdominal/pelvic findings, mass lesions or adenopathy.
2. No renal, ureteral or bladder calculi or worrisome renal or
bladder lesions without contrast.
3. Small periumbilical abdominal wall hernia containing fat.
